# Patient Record
Sex: Male | Born: 1950 | Race: White | Hispanic: No
Health system: Southern US, Community
[De-identification: ages and names within clinical notes are randomized; demographics above are authoritative.]

---

## 2015-02-12 ENCOUNTER — Inpatient Hospital Stay
Admission: AD | Admit: 2015-02-12 | Discharge: 2015-04-18 | Disposition: E | Payer: Self-pay | Source: Ambulatory Visit | Attending: Internal Medicine | Admitting: Internal Medicine

## 2015-02-12 DIAGNOSIS — Z95828 Presence of other vascular implants and grafts: Secondary | ICD-10-CM

## 2015-02-12 DIAGNOSIS — I509 Heart failure, unspecified: Secondary | ICD-10-CM

## 2015-02-12 DIAGNOSIS — J9690 Respiratory failure, unspecified, unspecified whether with hypoxia or hypercapnia: Secondary | ICD-10-CM

## 2015-02-12 DIAGNOSIS — J189 Pneumonia, unspecified organism: Secondary | ICD-10-CM

## 2015-02-12 DIAGNOSIS — I4819 Other persistent atrial fibrillation: Secondary | ICD-10-CM | POA: Insufficient documentation

## 2015-02-12 DIAGNOSIS — Z01818 Encounter for other preprocedural examination: Secondary | ICD-10-CM

## 2015-02-12 DIAGNOSIS — R0602 Shortness of breath: Secondary | ICD-10-CM

## 2015-02-12 DIAGNOSIS — J9600 Acute respiratory failure, unspecified whether with hypoxia or hypercapnia: Secondary | ICD-10-CM

## 2015-02-13 ENCOUNTER — Other Ambulatory Visit (HOSPITAL_COMMUNITY): Payer: Self-pay

## 2015-02-13 LAB — PROTIME-INR
INR: 1.02 (ref 0.00–1.49)
PROTHROMBIN TIME: 13.5 s (ref 11.6–15.2)

## 2015-02-13 LAB — COMPREHENSIVE METABOLIC PANEL
ALT: 49 U/L (ref 0–53)
ANION GAP: 1 — AB (ref 5–15)
AST: 21 U/L (ref 0–37)
Albumin: 2.7 g/dL — ABNORMAL LOW (ref 3.5–5.2)
Alkaline Phosphatase: 46 U/L (ref 39–117)
BUN: 20 mg/dL (ref 6–23)
CO2: 45 mmol/L (ref 19–32)
Calcium: 8.4 mg/dL (ref 8.4–10.5)
Chloride: 93 mmol/L — ABNORMAL LOW (ref 96–112)
Creatinine, Ser: 0.5 mg/dL (ref 0.50–1.35)
GLUCOSE: 188 mg/dL — AB (ref 70–99)
POTASSIUM: 4.2 mmol/L (ref 3.5–5.1)
Sodium: 139 mmol/L (ref 135–145)
Total Bilirubin: 0.5 mg/dL (ref 0.3–1.2)
Total Protein: 4.9 g/dL — ABNORMAL LOW (ref 6.0–8.3)

## 2015-02-13 LAB — CBC
HEMATOCRIT: 29.4 % — AB (ref 39.0–52.0)
Hemoglobin: 9.1 g/dL — ABNORMAL LOW (ref 13.0–17.0)
MCH: 28.1 pg (ref 26.0–34.0)
MCHC: 31 g/dL (ref 30.0–36.0)
MCV: 90.7 fL (ref 78.0–100.0)
Platelets: 140 10*3/uL — ABNORMAL LOW (ref 150–400)
RBC: 3.24 MIL/uL — AB (ref 4.22–5.81)
RDW: 15.6 % — AB (ref 11.5–15.5)
WBC: 8.9 10*3/uL (ref 4.0–10.5)

## 2015-02-13 LAB — MAGNESIUM: MAGNESIUM: 1.6 mg/dL (ref 1.5–2.5)

## 2015-02-13 LAB — BLOOD GAS, ARTERIAL
Acid-Base Excess: 13.4 mmol/L — ABNORMAL HIGH (ref 0.0–2.0)
Bicarbonate: 39 mEq/L — ABNORMAL HIGH (ref 20.0–24.0)
O2 Content: 6 L/min
O2 SAT: 91.5 %
Patient temperature: 98.6
TCO2: 41 mmol/L (ref 0–100)
pCO2 arterial: 66.2 mmHg (ref 35.0–45.0)
pH, Arterial: 7.387 (ref 7.350–7.450)
pO2, Arterial: 62.8 mmHg — ABNORMAL LOW (ref 80.0–100.0)

## 2015-02-13 LAB — PHOSPHORUS: PHOSPHORUS: 4 mg/dL (ref 2.3–4.6)

## 2015-02-14 LAB — CBC WITH DIFFERENTIAL/PLATELET
Basophils Absolute: 0 10*3/uL (ref 0.0–0.1)
Basophils Relative: 0 % (ref 0–1)
EOS PCT: 0 % (ref 0–5)
Eosinophils Absolute: 0 10*3/uL (ref 0.0–0.7)
HCT: 31.9 % — ABNORMAL LOW (ref 39.0–52.0)
HEMOGLOBIN: 10 g/dL — AB (ref 13.0–17.0)
LYMPHS PCT: 2 % — AB (ref 12–46)
Lymphs Abs: 0.2 10*3/uL — ABNORMAL LOW (ref 0.7–4.0)
MCH: 27.9 pg (ref 26.0–34.0)
MCHC: 31.3 g/dL (ref 30.0–36.0)
MCV: 89.1 fL (ref 78.0–100.0)
Monocytes Absolute: 0.3 10*3/uL (ref 0.1–1.0)
Monocytes Relative: 3 % (ref 3–12)
NEUTROS ABS: 8.5 10*3/uL — AB (ref 1.7–7.7)
NEUTROS PCT: 95 % — AB (ref 43–77)
PLATELETS: 148 10*3/uL — AB (ref 150–400)
RBC: 3.58 MIL/uL — AB (ref 4.22–5.81)
RDW: 15.4 % (ref 11.5–15.5)
WBC: 9.1 10*3/uL (ref 4.0–10.5)

## 2015-02-15 LAB — HEMOGLOBIN A1C
Hgb A1c MFr Bld: 7.1 % — ABNORMAL HIGH (ref 4.8–5.6)
Mean Plasma Glucose: 157 mg/dL

## 2015-02-15 LAB — POTASSIUM: POTASSIUM: 3.6 mmol/L (ref 3.5–5.1)

## 2015-02-16 LAB — BASIC METABOLIC PANEL
ANION GAP: 6 (ref 5–15)
BUN: 24 mg/dL — ABNORMAL HIGH (ref 6–23)
CALCIUM: 8.8 mg/dL (ref 8.4–10.5)
CHLORIDE: 94 mmol/L — AB (ref 96–112)
CO2: 38 mmol/L — ABNORMAL HIGH (ref 19–32)
Creatinine, Ser: 0.5 mg/dL (ref 0.50–1.35)
GFR calc Af Amer: >90 mL/min (ref >90)
Glucose, Bld: 146 mg/dL — ABNORMAL HIGH (ref 70–99)
Potassium: 4.7 mmol/L (ref 3.5–5.1)
SODIUM: 138 mmol/L (ref 135–145)

## 2015-02-16 DEATH — deceased

## 2015-02-17 LAB — BASIC METABOLIC PANEL
Anion gap: 3 — ABNORMAL LOW (ref 5–15)
BUN: 26 mg/dL — ABNORMAL HIGH (ref 6–23)
CO2: 40 mmol/L — AB (ref 19–32)
CREATININE: 0.54 mg/dL (ref 0.50–1.35)
Calcium: 8.5 mg/dL (ref 8.4–10.5)
Chloride: 92 mmol/L — ABNORMAL LOW (ref 96–112)
GFR calc Af Amer: >90 mL/min (ref >90)
GFR calc non Af Amer: >90 mL/min (ref >90)
Glucose, Bld: 160 mg/dL — ABNORMAL HIGH (ref 70–99)
Potassium: 4.9 mmol/L (ref 3.5–5.1)
Sodium: 135 mmol/L (ref 135–145)

## 2015-02-18 LAB — POTASSIUM: POTASSIUM: 4.7 mmol/L (ref 3.5–5.1)

## 2015-02-19 LAB — CBC
HCT: 28.7 % — ABNORMAL LOW (ref 39.0–52.0)
Hemoglobin: 9 g/dL — ABNORMAL LOW (ref 13.0–17.0)
MCH: 28.1 pg (ref 26.0–34.0)
MCHC: 31.4 g/dL (ref 30.0–36.0)
MCV: 89.7 fL (ref 78.0–100.0)
PLATELETS: 194 10*3/uL (ref 150–400)
RBC: 3.2 MIL/uL — ABNORMAL LOW (ref 4.22–5.81)
RDW: 16.2 % — ABNORMAL HIGH (ref 11.5–15.5)
WBC: 6.7 10*3/uL (ref 4.0–10.5)

## 2015-02-21 LAB — BLOOD GAS, ARTERIAL

## 2015-02-22 LAB — BASIC METABOLIC PANEL
Anion gap: 5 (ref 5–15)
BUN: 30 mg/dL — AB (ref 6–23)
CHLORIDE: 90 mmol/L — AB (ref 96–112)
CO2: 39 mmol/L — AB (ref 19–32)
Calcium: 8.9 mg/dL (ref 8.4–10.5)
Creatinine, Ser: 0.52 mg/dL (ref 0.50–1.35)
GFR calc Af Amer: >90 mL/min (ref >90)
GFR calc non Af Amer: >90 mL/min (ref >90)
GLUCOSE: 115 mg/dL — AB (ref 70–99)
POTASSIUM: 4.6 mmol/L (ref 3.5–5.1)
Sodium: 134 mmol/L — ABNORMAL LOW (ref 135–145)

## 2015-02-22 LAB — CBC
HEMATOCRIT: 33.1 % — AB (ref 39.0–52.0)
HEMOGLOBIN: 10.4 g/dL — AB (ref 13.0–17.0)
MCH: 28.5 pg (ref 26.0–34.0)
MCHC: 31.4 g/dL (ref 30.0–36.0)
MCV: 90.7 fL (ref 78.0–100.0)
Platelets: 288 10*3/uL (ref 150–400)
RBC: 3.65 MIL/uL — ABNORMAL LOW (ref 4.22–5.81)
RDW: 17.3 % — ABNORMAL HIGH (ref 11.5–15.5)
WBC: 13.2 10*3/uL — ABNORMAL HIGH (ref 4.0–10.5)

## 2015-02-25 ENCOUNTER — Other Ambulatory Visit (HOSPITAL_COMMUNITY): Payer: Self-pay

## 2015-02-25 LAB — URINALYSIS, ROUTINE W REFLEX MICROSCOPIC
Bilirubin Urine: NEGATIVE
Glucose, UA: NEGATIVE mg/dL
Hgb urine dipstick: NEGATIVE
Ketones, ur: NEGATIVE mg/dL
Leukocytes, UA: NEGATIVE
NITRITE: NEGATIVE
PH: 6.5 (ref 5.0–8.0)
Protein, ur: NEGATIVE mg/dL
Specific Gravity, Urine: 1.01 (ref 1.005–1.030)
Urobilinogen, UA: 0.2 mg/dL (ref 0.0–1.0)

## 2015-02-25 LAB — CBC WITH DIFFERENTIAL/PLATELET
BAND NEUTROPHILS: 16 % — AB (ref 0–10)
BLASTS: 2 %
Basophils Absolute: 0 10*3/uL (ref 0.0–0.1)
Basophils Relative: 0 % (ref 0–1)
Eosinophils Absolute: 0 10*3/uL (ref 0.0–0.7)
Eosinophils Relative: 0 % (ref 0–5)
HEMATOCRIT: 33.4 % — AB (ref 39.0–52.0)
HEMOGLOBIN: 10.5 g/dL — AB (ref 13.0–17.0)
Lymphocytes Relative: 9 % — ABNORMAL LOW (ref 12–46)
Lymphs Abs: 2 10*3/uL (ref 0.7–4.0)
MCH: 29.2 pg (ref 26.0–34.0)
MCHC: 31.4 g/dL (ref 30.0–36.0)
MCV: 92.8 fL (ref 78.0–100.0)
METAMYELOCYTES PCT: 4 %
Monocytes Absolute: 2 10*3/uL — ABNORMAL HIGH (ref 0.1–1.0)
Monocytes Relative: 9 % (ref 3–12)
Myelocytes: 5 %
NEUTROS PCT: 55 % (ref 43–77)
Neutro Abs: 17.5 10*3/uL — ABNORMAL HIGH (ref 1.7–7.7)
PLATELETS: 278 10*3/uL (ref 150–400)
Promyelocytes Absolute: 0 %
RBC: 3.6 MIL/uL — ABNORMAL LOW (ref 4.22–5.81)
RDW: 18.5 % — AB (ref 11.5–15.5)
Smear Review: ADEQUATE
WBC: 21.9 10*3/uL — ABNORMAL HIGH (ref 4.0–10.5)
nRBC: 0 /100 WBC

## 2015-02-25 LAB — BASIC METABOLIC PANEL
Anion gap: 8 (ref 5–15)
BUN: 28 mg/dL — ABNORMAL HIGH (ref 6–23)
CHLORIDE: 90 mmol/L — AB (ref 96–112)
CO2: 36 mmol/L — AB (ref 19–32)
Calcium: 9 mg/dL (ref 8.4–10.5)
Creatinine, Ser: 0.55 mg/dL (ref 0.50–1.35)
GFR calc non Af Amer: >90 mL/min (ref >90)
GLUCOSE: 144 mg/dL — AB (ref 70–99)
Potassium: 5.1 mmol/L (ref 3.5–5.1)
Sodium: 134 mmol/L — ABNORMAL LOW (ref 135–145)

## 2015-02-25 LAB — MAGNESIUM: Magnesium: 1.9 mg/dL (ref 1.5–2.5)

## 2015-02-25 LAB — PHOSPHORUS: Phosphorus: 4.2 mg/dL (ref 2.3–4.6)

## 2015-02-26 LAB — URINE CULTURE
CULTURE: NO GROWTH
Colony Count: NO GROWTH

## 2015-02-26 LAB — BASIC METABOLIC PANEL
ANION GAP: 3 — AB (ref 5–15)
BUN: 28 mg/dL — ABNORMAL HIGH (ref 6–23)
CO2: 40 mmol/L (ref 19–32)
Calcium: 9 mg/dL (ref 8.4–10.5)
Chloride: 91 mmol/L — ABNORMAL LOW (ref 96–112)
Creatinine, Ser: 0.57 mg/dL (ref 0.50–1.35)
GFR calc non Af Amer: >90 mL/min (ref >90)
Glucose, Bld: 144 mg/dL — ABNORMAL HIGH (ref 70–99)
Potassium: 4.7 mmol/L (ref 3.5–5.1)
SODIUM: 134 mmol/L — AB (ref 135–145)

## 2015-02-26 LAB — CBC WITH DIFFERENTIAL/PLATELET
BASOS ABS: 0.2 10*3/uL — AB (ref 0.0–0.1)
Basophils Relative: 1 % (ref 0–1)
EOS ABS: 0 10*3/uL (ref 0.0–0.7)
Eosinophils Relative: 0 % (ref 0–5)
HCT: 34.1 % — ABNORMAL LOW (ref 39.0–52.0)
Hemoglobin: 10.5 g/dL — ABNORMAL LOW (ref 13.0–17.0)
LYMPHS PCT: 6 % — AB (ref 12–46)
Lymphs Abs: 1 10*3/uL (ref 0.7–4.0)
MCH: 28.7 pg (ref 26.0–34.0)
MCHC: 30.8 g/dL (ref 30.0–36.0)
MCV: 93.2 fL (ref 78.0–100.0)
Monocytes Absolute: 1.2 10*3/uL — ABNORMAL HIGH (ref 0.1–1.0)
Monocytes Relative: 7 % (ref 3–12)
NEUTROS PCT: 86 % — AB (ref 43–77)
Neutro Abs: 14.5 10*3/uL — ABNORMAL HIGH (ref 1.7–7.7)
Platelets: 238 10*3/uL (ref 150–400)
RBC: 3.66 MIL/uL — ABNORMAL LOW (ref 4.22–5.81)
RDW: 18.6 % — AB (ref 11.5–15.5)
WBC: 16.9 10*3/uL — ABNORMAL HIGH (ref 4.0–10.5)

## 2015-02-26 LAB — CLOSTRIDIUM DIFFICILE BY PCR: CDIFFPCR: NEGATIVE

## 2015-02-27 LAB — PATHOLOGIST SMEAR REVIEW

## 2015-02-28 LAB — CBC WITH DIFFERENTIAL/PLATELET
Basophils Absolute: 0.1 10*3/uL (ref 0.0–0.1)
Basophils Relative: 0 % (ref 0–1)
EOS PCT: 0 % (ref 0–5)
Eosinophils Absolute: 0.1 10*3/uL (ref 0.0–0.7)
HCT: 30.5 % — ABNORMAL LOW (ref 39.0–52.0)
Hemoglobin: 9.8 g/dL — ABNORMAL LOW (ref 13.0–17.0)
Lymphocytes Relative: 5 % — ABNORMAL LOW (ref 12–46)
Lymphs Abs: 1 10*3/uL (ref 0.7–4.0)
MCH: 30 pg (ref 26.0–34.0)
MCHC: 32.1 g/dL (ref 30.0–36.0)
MCV: 93.3 fL (ref 78.0–100.0)
MONOS PCT: 8 % (ref 3–12)
Monocytes Absolute: 1.5 10*3/uL — ABNORMAL HIGH (ref 0.1–1.0)
NEUTROS PCT: 87 % — AB (ref 43–77)
Neutro Abs: 16.3 10*3/uL — ABNORMAL HIGH (ref 1.7–7.7)
Platelets: 161 10*3/uL (ref 150–400)
RBC: 3.27 MIL/uL — ABNORMAL LOW (ref 4.22–5.81)
RDW: 18.9 % — ABNORMAL HIGH (ref 11.5–15.5)
WBC: 18.9 10*3/uL — AB (ref 4.0–10.5)

## 2015-02-28 LAB — BASIC METABOLIC PANEL
ANION GAP: 9 (ref 5–15)
BUN: 31 mg/dL — AB (ref 6–23)
CHLORIDE: 91 mmol/L — AB (ref 96–112)
CO2: 35 mmol/L — AB (ref 19–32)
Calcium: 8.8 mg/dL (ref 8.4–10.5)
Creatinine, Ser: 0.6 mg/dL (ref 0.50–1.35)
GFR calc Af Amer: >90 mL/min (ref >90)
GFR calc non Af Amer: >90 mL/min (ref >90)
Glucose, Bld: 129 mg/dL — ABNORMAL HIGH (ref 70–99)
Potassium: 4.6 mmol/L (ref 3.5–5.1)
Sodium: 135 mmol/L (ref 135–145)

## 2015-02-28 LAB — PHOSPHORUS: Phosphorus: 4.2 mg/dL (ref 2.3–4.6)

## 2015-02-28 LAB — MAGNESIUM: Magnesium: 1.8 mg/dL (ref 1.5–2.5)

## 2015-03-01 ENCOUNTER — Other Ambulatory Visit (HOSPITAL_COMMUNITY): Payer: Self-pay

## 2015-03-01 LAB — CBC WITH DIFFERENTIAL/PLATELET
BASOS ABS: 0 10*3/uL (ref 0.0–0.1)
BASOS PCT: 0 % (ref 0–1)
Band Neutrophils: 23 % — ABNORMAL HIGH (ref 0–10)
Blasts: 0 %
EOS ABS: 0.6 10*3/uL (ref 0.0–0.7)
EOS PCT: 1 % (ref 0–5)
HCT: 31.7 % — ABNORMAL LOW (ref 39.0–52.0)
HEMOGLOBIN: 10.1 g/dL — AB (ref 13.0–17.0)
Lymphocytes Relative: 1 % — ABNORMAL LOW (ref 12–46)
Lymphs Abs: 0.6 10*3/uL — ABNORMAL LOW (ref 0.7–4.0)
MCH: 29.2 pg (ref 26.0–34.0)
MCHC: 31.9 g/dL (ref 30.0–36.0)
MCV: 91.6 fL (ref 78.0–100.0)
MONO ABS: 2.9 10*3/uL — AB (ref 0.1–1.0)
MYELOCYTES: 0 %
Metamyelocytes Relative: 0 %
Monocytes Relative: 5 % (ref 3–12)
NEUTROS ABS: 54 10*3/uL — AB (ref 1.7–7.7)
NEUTROS PCT: 70 % (ref 43–77)
Platelets: 172 10*3/uL (ref 150–400)
Promyelocytes Absolute: 0 %
RBC: 3.46 MIL/uL — ABNORMAL LOW (ref 4.22–5.81)
RDW: 18.8 % — ABNORMAL HIGH (ref 11.5–15.5)
WBC: 58.1 10*3/uL (ref 4.0–10.5)
nRBC: 0 /100 WBC

## 2015-03-01 LAB — BLOOD GAS, ARTERIAL
ACID-BASE EXCESS: 6.7 mmol/L — AB (ref 0.0–2.0)
Acid-Base Excess: 3 mmol/L — ABNORMAL HIGH (ref 0.0–2.0)
BICARBONATE: 28 meq/L — AB (ref 20.0–24.0)
BICARBONATE: 31.6 meq/L — AB (ref 20.0–24.0)
FIO2: 0.5 %
O2 CONTENT: 6 L/min
O2 SAT: 86.9 %
O2 SAT: 82.8 %
PATIENT TEMPERATURE: 98.6
PO2 ART: 49.4 mmHg — AB (ref 80.0–100.0)
Patient temperature: 98.6
TCO2: 29.5 mmol/L (ref 0–100)
TCO2: 33.3 mmol/L (ref 0–100)
pCO2 arterial: 51.1 mmHg — ABNORMAL HIGH (ref 35.0–45.0)
pCO2 arterial: 54 mmHg — ABNORMAL HIGH (ref 35.0–45.0)
pH, Arterial: 7.358 (ref 7.350–7.450)
pH, Arterial: 7.386 (ref 7.350–7.450)
pO2, Arterial: 55.9 mmHg — ABNORMAL LOW (ref 80.0–100.0)

## 2015-03-01 LAB — COMPREHENSIVE METABOLIC PANEL
ALT: 41 U/L (ref 0–53)
AST: 26 U/L (ref 0–37)
Albumin: 2.6 g/dL — ABNORMAL LOW (ref 3.5–5.2)
Alkaline Phosphatase: 62 U/L (ref 39–117)
Anion gap: 10 (ref 5–15)
BILIRUBIN TOTAL: 0.6 mg/dL (ref 0.3–1.2)
BUN: 31 mg/dL — ABNORMAL HIGH (ref 6–23)
CALCIUM: 8.4 mg/dL (ref 8.4–10.5)
CHLORIDE: 90 mmol/L — AB (ref 96–112)
CO2: 32 mmol/L (ref 19–32)
CREATININE: 0.95 mg/dL (ref 0.50–1.35)
GFR calc non Af Amer: 85 mL/min — ABNORMAL LOW (ref >90)
GLUCOSE: 42 mg/dL — AB (ref 70–99)
Potassium: 4 mmol/L (ref 3.5–5.1)
SODIUM: 132 mmol/L — AB (ref 135–145)
Total Protein: 5.1 g/dL — ABNORMAL LOW (ref 6.0–8.3)

## 2015-03-01 LAB — URINALYSIS, ROUTINE W REFLEX MICROSCOPIC
Bilirubin Urine: NEGATIVE
Glucose, UA: NEGATIVE mg/dL
HGB URINE DIPSTICK: NEGATIVE
Ketones, ur: NEGATIVE mg/dL
LEUKOCYTES UA: NEGATIVE
Nitrite: NEGATIVE
PH: 6.5 (ref 5.0–8.0)
PROTEIN: NEGATIVE mg/dL
SPECIFIC GRAVITY, URINE: 1.018 (ref 1.005–1.030)
Urobilinogen, UA: 1 mg/dL (ref 0.0–1.0)

## 2015-03-01 LAB — CK TOTAL AND CKMB (NOT AT ARMC)
CK, MB: 3.4 ng/mL (ref 0.3–4.0)
Relative Index: INVALID (ref 0.0–2.5)
Total CK: 19 U/L (ref 7–232)

## 2015-03-01 LAB — LACTIC ACID, PLASMA
LACTIC ACID, VENOUS: 2.1 mmol/L — AB (ref 0.5–2.0)
Lactic Acid, Venous: 1.8 mmol/L (ref 0.5–2.0)

## 2015-03-01 LAB — TROPONIN I
TROPONIN I: 0.07 ng/mL — AB (ref <0.031)
Troponin I: 0.05 ng/mL — ABNORMAL HIGH (ref <0.031)

## 2015-03-01 LAB — PROCALCITONIN: Procalcitonin: 4.16 ng/mL

## 2015-03-02 ENCOUNTER — Other Ambulatory Visit (HOSPITAL_COMMUNITY): Payer: Self-pay

## 2015-03-02 LAB — BASIC METABOLIC PANEL
Anion gap: 9 (ref 5–15)
BUN: 19 mg/dL (ref 6–23)
CALCIUM: 8.2 mg/dL — AB (ref 8.4–10.5)
CO2: 29 mmol/L (ref 19–32)
CREATININE: 0.6 mg/dL (ref 0.50–1.35)
Chloride: 94 mmol/L — ABNORMAL LOW (ref 96–112)
GFR calc Af Amer: >90 mL/min (ref >90)
GLUCOSE: 133 mg/dL — AB (ref 70–99)
POTASSIUM: 3.5 mmol/L (ref 3.5–5.1)
SODIUM: 132 mmol/L — AB (ref 135–145)

## 2015-03-02 LAB — CBC WITH DIFFERENTIAL/PLATELET
BASOS ABS: 0 10*3/uL (ref 0.0–0.1)
Basophils Relative: 0 % (ref 0–1)
Eosinophils Absolute: 0 10*3/uL (ref 0.0–0.7)
Eosinophils Relative: 0 % (ref 0–5)
HEMATOCRIT: 29.5 % — AB (ref 39.0–52.0)
Hemoglobin: 9.5 g/dL — ABNORMAL LOW (ref 13.0–17.0)
LYMPHS ABS: 1.1 10*3/uL (ref 0.7–4.0)
LYMPHS PCT: 2 % — AB (ref 12–46)
MCH: 29.8 pg (ref 26.0–34.0)
MCHC: 32.2 g/dL (ref 30.0–36.0)
MCV: 92.5 fL (ref 78.0–100.0)
Monocytes Absolute: 4.6 10*3/uL — ABNORMAL HIGH (ref 0.1–1.0)
Monocytes Relative: 8 % (ref 3–12)
NEUTROS ABS: 51.5 10*3/uL — AB (ref 1.7–7.7)
Neutrophils Relative %: 90 % — ABNORMAL HIGH (ref 43–77)
PLATELETS: 133 10*3/uL — AB (ref 150–400)
RBC: 3.19 MIL/uL — AB (ref 4.22–5.81)
RDW: 18.4 % — AB (ref 11.5–15.5)
WBC: 57.2 10*3/uL (ref 4.0–10.5)

## 2015-03-02 LAB — BLOOD GAS, ARTERIAL
ACID-BASE EXCESS: 5.5 mmol/L — AB (ref 0.0–2.0)
BICARBONATE: 30.7 meq/L — AB (ref 20.0–24.0)
Delivery systems: POSITIVE
Expiratory PAP: 6
FIO2: 0.6 %
INSPIRATORY PAP: 12
O2 Saturation: 97.1 %
PATIENT TEMPERATURE: 95.9
PO2 ART: 74.2 mmHg — AB (ref 80.0–100.0)
TCO2: 32.3 mmol/L (ref 0–100)
pCO2 arterial: 51.2 mmHg — ABNORMAL HIGH (ref 35.0–45.0)
pH, Arterial: 7.386 (ref 7.350–7.450)

## 2015-03-02 LAB — LACTIC ACID, PLASMA: LACTIC ACID, VENOUS: 0.8 mmol/L (ref 0.5–2.0)

## 2015-03-02 LAB — TROPONIN I
TROPONIN I: 0.07 ng/mL — AB (ref <0.031)
Troponin I: 0.07 ng/mL — ABNORMAL HIGH (ref <0.031)
Troponin I: 0.12 ng/mL — ABNORMAL HIGH (ref <0.031)
Troponin I: 0.13 ng/mL — ABNORMAL HIGH (ref <0.031)

## 2015-03-03 ENCOUNTER — Encounter: Payer: Self-pay | Admitting: Radiology

## 2015-03-03 ENCOUNTER — Institutional Professional Consult (permissible substitution) (HOSPITAL_COMMUNITY): Payer: Self-pay

## 2015-03-03 LAB — CBC WITH DIFFERENTIAL/PLATELET
BASOS ABS: 0.4 10*3/uL — AB (ref 0.0–0.1)
Basophils Absolute: 0 10*3/uL (ref 0.0–0.1)
Basophils Relative: 1 % (ref 0–1)
Basophils Relative: 0 % (ref 0–1)
EOS ABS: 0 10*3/uL (ref 0.0–0.7)
Eosinophils Absolute: 0 10*3/uL (ref 0.0–0.7)
Eosinophils Relative: 0 % (ref 0–5)
Eosinophils Relative: 0 % (ref 0–5)
HCT: 25.9 % — ABNORMAL LOW (ref 39.0–52.0)
HEMATOCRIT: 29.3 % — AB (ref 39.0–52.0)
HEMOGLOBIN: 9.1 g/dL — AB (ref 13.0–17.0)
HEMOGLOBIN: 8.2 g/dL — AB (ref 13.0–17.0)
LYMPHS PCT: 2 % — AB (ref 12–46)
Lymphocytes Relative: 2 % — ABNORMAL LOW (ref 12–46)
Lymphs Abs: 0.9 10*3/uL (ref 0.7–4.0)
Lymphs Abs: 0.5 10*3/uL — ABNORMAL LOW (ref 0.7–4.0)
MCH: 28.7 pg (ref 26.0–34.0)
MCH: 29.1 pg (ref 26.0–34.0)
MCHC: 31.1 g/dL (ref 30.0–36.0)
MCHC: 31.7 g/dL (ref 30.0–36.0)
MCV: 92.4 fL (ref 78.0–100.0)
MCV: 91.8 fL (ref 78.0–100.0)
MONOS PCT: 5 % (ref 3–12)
MONOS PCT: 4 % (ref 3–12)
Monocytes Absolute: 2.2 10*3/uL — ABNORMAL HIGH (ref 0.1–1.0)
Monocytes Absolute: 1.1 10*3/uL — ABNORMAL HIGH (ref 0.1–1.0)
NEUTROS ABS: 39.5 10*3/uL — AB (ref 1.7–7.7)
NEUTROS ABS: 25.3 10*3/uL — AB (ref 1.7–7.7)
Neutrophils Relative %: 92 % — ABNORMAL HIGH (ref 43–77)
Neutrophils Relative %: 94 % — ABNORMAL HIGH (ref 43–77)
PLATELETS: 106 10*3/uL — AB (ref 150–400)
Platelets: 113 10*3/uL — ABNORMAL LOW (ref 150–400)
RBC: 3.17 MIL/uL — AB (ref 4.22–5.81)
RBC: 2.82 MIL/uL — ABNORMAL LOW (ref 4.22–5.81)
RDW: 18.2 % — ABNORMAL HIGH (ref 11.5–15.5)
RDW: 18.3 % — ABNORMAL HIGH (ref 11.5–15.5)
WBC: 43 10*3/uL — AB (ref 4.0–10.5)
WBC: 26.9 10*3/uL — ABNORMAL HIGH (ref 4.0–10.5)

## 2015-03-03 LAB — TROPONIN I
TROPONIN I: 0.24 ng/mL — AB (ref <0.031)
TROPONIN I: 0.03 ng/mL (ref <0.031)
Troponin I: 0.07 ng/mL — ABNORMAL HIGH (ref <0.031)

## 2015-03-03 LAB — URINE CULTURE: Colony Count: >=100000

## 2015-03-03 LAB — CK TOTAL AND CKMB (NOT AT ARMC)
CK, MB: 5.3 ng/mL — ABNORMAL HIGH (ref 0.3–4.0)
RELATIVE INDEX: INVALID (ref 0.0–2.5)
Total CK: 27 U/L (ref 7–232)

## 2015-03-03 LAB — BASIC METABOLIC PANEL
ANION GAP: 7 (ref 5–15)
Anion gap: 13 (ref 5–15)
BUN: 16 mg/dL (ref 6–23)
BUN: 12 mg/dL (ref 6–23)
CALCIUM: 8.1 mg/dL — AB (ref 8.4–10.5)
CHLORIDE: 93 mmol/L — AB (ref 96–112)
CHLORIDE: 100 mmol/L (ref 96–112)
CO2: 26 mmol/L (ref 19–32)
CO2: 29 mmol/L (ref 19–32)
CREATININE: 0.56 mg/dL (ref 0.50–1.35)
CREATININE: 0.47 mg/dL — AB (ref 0.50–1.35)
Calcium: 7.9 mg/dL — ABNORMAL LOW (ref 8.4–10.5)
GFR calc Af Amer: >90 mL/min (ref >90)
GFR calc Af Amer: >90 mL/min (ref >90)
GFR calc non Af Amer: >90 mL/min (ref >90)
GLUCOSE: 230 mg/dL — AB (ref 70–99)
Glucose, Bld: 225 mg/dL — ABNORMAL HIGH (ref 70–99)
POTASSIUM: 4.2 mmol/L (ref 3.5–5.1)
Potassium: 3.4 mmol/L — ABNORMAL LOW (ref 3.5–5.1)
SODIUM: 132 mmol/L — AB (ref 135–145)
Sodium: 136 mmol/L (ref 135–145)

## 2015-03-03 LAB — EXPECTORATED SPUTUM ASSESSMENT W GRAM STAIN, RFLX TO RESP C

## 2015-03-03 LAB — EXPECTORATED SPUTUM ASSESSMENT W REFEX TO RESP CULTURE

## 2015-03-03 LAB — DIGOXIN LEVEL: Digoxin Level: <0.2 ng/mL — ABNORMAL LOW (ref 0.8–2.0)

## 2015-03-03 LAB — MAGNESIUM: Magnesium: 1.7 mg/dL (ref 1.5–2.5)

## 2015-03-03 MED ORDER — IOHEXOL 300 MG/ML  SOLN
100.0000 mL | Freq: Once | INTRAMUSCULAR | Status: AC | PRN
  Administered 2015-03-03: 100 mL via INTRAVENOUS

## 2015-03-04 LAB — CULTURE, BLOOD (ROUTINE X 2)

## 2015-03-04 LAB — TROPONIN I: Troponin I: 0.07 ng/mL — ABNORMAL HIGH (ref <0.031)

## 2015-03-05 LAB — CBC WITH DIFFERENTIAL/PLATELET
BASOS ABS: 0 10*3/uL (ref 0.0–0.1)
BASOS PCT: 0 % (ref 0–1)
Eosinophils Absolute: 0.1 10*3/uL (ref 0.0–0.7)
Eosinophils Relative: 1 % (ref 0–5)
HCT: 28 % — ABNORMAL LOW (ref 39.0–52.0)
Hemoglobin: 8.4 g/dL — ABNORMAL LOW (ref 13.0–17.0)
Lymphocytes Relative: 6 % — ABNORMAL LOW (ref 12–46)
Lymphs Abs: 0.7 10*3/uL (ref 0.7–4.0)
MCH: 28.4 pg (ref 26.0–34.0)
MCHC: 30 g/dL (ref 30.0–36.0)
MCV: 94.6 fL (ref 78.0–100.0)
Monocytes Absolute: 0.7 10*3/uL (ref 0.1–1.0)
Monocytes Relative: 7 % (ref 3–12)
NEUTROS PCT: 87 % — AB (ref 43–77)
Neutro Abs: 9.5 10*3/uL — ABNORMAL HIGH (ref 1.7–7.7)
Platelets: 135 10*3/uL — ABNORMAL LOW (ref 150–400)
RBC: 2.96 MIL/uL — AB (ref 4.22–5.81)
RDW: 18.3 % — ABNORMAL HIGH (ref 11.5–15.5)
WBC: 11 10*3/uL — ABNORMAL HIGH (ref 4.0–10.5)

## 2015-03-05 LAB — COMPREHENSIVE METABOLIC PANEL
ALBUMIN: 2 g/dL — AB (ref 3.5–5.2)
ALT: 82 U/L — ABNORMAL HIGH (ref 0–53)
AST: 26 U/L (ref 0–37)
Alkaline Phosphatase: 103 U/L (ref 39–117)
Anion gap: 8 (ref 5–15)
BUN: 15 mg/dL (ref 6–23)
CALCIUM: 8.7 mg/dL (ref 8.4–10.5)
CO2: 33 mmol/L — ABNORMAL HIGH (ref 19–32)
Chloride: 100 mmol/L (ref 96–112)
Creatinine, Ser: 0.44 mg/dL — ABNORMAL LOW (ref 0.50–1.35)
GFR calc Af Amer: >90 mL/min (ref >90)
GFR calc non Af Amer: >90 mL/min (ref >90)
Glucose, Bld: 107 mg/dL — ABNORMAL HIGH (ref 70–99)
Potassium: 3.7 mmol/L (ref 3.5–5.1)
Sodium: 141 mmol/L (ref 135–145)
TOTAL PROTEIN: 4.8 g/dL — AB (ref 6.0–8.3)
Total Bilirubin: 0.5 mg/dL (ref 0.3–1.2)

## 2015-03-05 LAB — DIGOXIN LEVEL: DIGOXIN LVL: 0.5 ng/mL — AB (ref 0.8–2.0)

## 2015-03-06 DIAGNOSIS — J189 Pneumonia, unspecified organism: Secondary | ICD-10-CM

## 2015-03-06 DIAGNOSIS — I4891 Unspecified atrial fibrillation: Secondary | ICD-10-CM

## 2015-03-06 DIAGNOSIS — I4819 Other persistent atrial fibrillation: Secondary | ICD-10-CM | POA: Insufficient documentation

## 2015-03-06 DIAGNOSIS — I481 Persistent atrial fibrillation: Secondary | ICD-10-CM

## 2015-03-06 LAB — TSH: TSH: 0.994 u[IU]/mL (ref 0.350–4.500)

## 2015-03-06 LAB — CBC
HCT: 29.7 % — ABNORMAL LOW (ref 39.0–52.0)
Hemoglobin: 9.1 g/dL — ABNORMAL LOW (ref 13.0–17.0)
MCH: 28.5 pg (ref 26.0–34.0)
MCHC: 30.6 g/dL (ref 30.0–36.0)
MCV: 93.1 fL (ref 78.0–100.0)
Platelets: 189 10*3/uL (ref 150–400)
RBC: 3.19 MIL/uL — AB (ref 4.22–5.81)
RDW: 18.6 % — ABNORMAL HIGH (ref 11.5–15.5)
WBC: 15.4 10*3/uL — AB (ref 4.0–10.5)

## 2015-03-06 LAB — TROPONIN I: Troponin I: 0.03 ng/mL (ref <0.031)

## 2015-03-06 LAB — CULTURE, RESPIRATORY W GRAM STAIN

## 2015-03-06 LAB — CK TOTAL AND CKMB (NOT AT ARMC)
CK, MB: 3.2 ng/mL (ref 0.3–4.0)
Relative Index: INVALID (ref 0.0–2.5)
Total CK: 18 U/L (ref 7–232)

## 2015-03-06 LAB — CULTURE, RESPIRATORY

## 2015-03-06 LAB — BRAIN NATRIURETIC PEPTIDE: B NATRIURETIC PEPTIDE 5: 900.6 pg/mL — AB (ref 0.0–100.0)

## 2015-03-06 NOTE — Progress Notes (Signed)
  Echocardiogram 2D Echocardiogram has been performed.  Janalyn HarderWest, Jetson Pickrel R 03/06/2015, 2:23 PM

## 2015-03-06 NOTE — Consult Note (Signed)
    Primary Physician: Primary Cardiologist:  New   Asked to see re tachycardia   HPI: [place] was adm[facility] on 12/08/14 [facility]  Patient has hisitory of atrial fib, small cell lung CA, HTN, GERD, CAD , tob use, narcotic use, COPD, MRSA   Earlier in hospitalization was in SR   [place]  Early April as on 3L [place] and weaned from ABX  Still SOB  On 4/14 HR increased    WBC 58 K  CXR with LLL consolication   Bl Cult + MRSA; Urine + MRSA   Patient treated with IV and po diltiazem, IV lopressor, digoxin    On 4/17 Levophed d/c'd   Yesterday HR consistently in 160s starting in PM  This AM HR in 150s (afib)  Around 1 PM tachycardia increased to 170      CUrrent meds::  Amiodarone 400 bid   Metoprolol 25 q 8 hours;  Dilt IV  Digoxin 0.125 IV  Pepcid, Nicotene patch, Solucortef, oxycontin, Lovenox qd.   Allergies :  ASA  PMH  As noted above.    FHx.  Negative for CAD  PSH:  S/p lung surgery  SH:  Single  + tobacco  +narcotics        REVIEW OF SYSTEMS:  All systems reviewed  Negative to the above problem except as noted above.    PHYSICAL EXAM: BP 95-140/60-70s  HR 170s      No intake or output data in the 24 hours ending 03/06/15 1804  General:  Patinet is in NAD though on 100% NRB HEENT: normal Neck: supple. no JVD. Carotids 2+ bilat; no bruits. No lymphadenopathy or thryomegaly appreciated. Cor: PMI nondisplaced. Regular rate & rhythm. No rubs, gallops or murmurs. Lungs: Decreased aiflow bilaterally   Abdomen: soft, nontender, nondistended. No hepatosplenomegaly. No bruits or masses. Good bowel sounds. Extremities: no cyanosis, clubbing, rash, edema Neuro: alert & oriented x 3, cranial nerves grossly intact. moves all 4 extremities w/o difficulty. Affect pleasant.  ECG  SVT  Probable atrial fib with RVR  173 bpm    Results for orders placed or performed during the hospital encounter of 001/22/16 (from the past 24 hour(s))   CK total and CKMB  (cardiac)     Status: None   Collection Time: 03/06/15 11:10 AM  Result Value Ref Range   Total CK 18 7 - 232 U/L   CK, MB 3.2 0.3 - 4.0 ng/mL   Relative Index RELATIVE INDEX IS INVALID 0.0 - 2.5  Troponin I     Status: None   Collection Time: 03/06/15 11:10 AM  Result Value Ref Range   Troponin I 0.03 <0.031 ng/mL   No results found.   ASSESSMENT: 64 yo male with atrial fib Has been in and out up to this point   Now with very rapid rates.   BP has been adequate.    Would d/c dig and po amiodarone  Continue dilt  Begin IV amiodarone     Lung dz will be limiting.    Echo today is limited due to HR  Grossly , LVEF and RVEF appear normal  Watch IV fluids  Currently several liters positive   2  HTN  Follow  3.  ID  On ABX for pneumonia, bacteremia  Off pressors

## 2015-03-07 ENCOUNTER — Other Ambulatory Visit (HOSPITAL_COMMUNITY): Payer: Self-pay

## 2015-03-07 LAB — CBC WITH DIFFERENTIAL/PLATELET
BASOS PCT: 0 % (ref 0–1)
Basophils Absolute: 0 10*3/uL (ref 0.0–0.1)
EOS ABS: 0.1 10*3/uL (ref 0.0–0.7)
EOS PCT: 1 % (ref 0–5)
HCT: 29.5 % — ABNORMAL LOW (ref 39.0–52.0)
HEMOGLOBIN: 8.9 g/dL — AB (ref 13.0–17.0)
LYMPHS ABS: 0.8 10*3/uL (ref 0.7–4.0)
Lymphocytes Relative: 5 % — ABNORMAL LOW (ref 12–46)
MCH: 28.5 pg (ref 26.0–34.0)
MCHC: 30.2 g/dL (ref 30.0–36.0)
MCV: 94.6 fL (ref 78.0–100.0)
MONO ABS: 1 10*3/uL (ref 0.1–1.0)
MONOS PCT: 7 % (ref 3–12)
NEUTROS PCT: 87 % — AB (ref 43–77)
Neutro Abs: 12.5 10*3/uL — ABNORMAL HIGH (ref 1.7–7.7)
Platelets: 181 10*3/uL (ref 150–400)
RBC: 3.12 MIL/uL — AB (ref 4.22–5.81)
RDW: 19.1 % — ABNORMAL HIGH (ref 11.5–15.5)
WBC: 14.4 10*3/uL — ABNORMAL HIGH (ref 4.0–10.5)

## 2015-03-07 LAB — BASIC METABOLIC PANEL
ANION GAP: 9 (ref 5–15)
BUN: 21 mg/dL (ref 6–23)
CO2: 33 mmol/L — ABNORMAL HIGH (ref 19–32)
Calcium: 8.3 mg/dL — ABNORMAL LOW (ref 8.4–10.5)
Chloride: 100 mmol/L (ref 96–112)
Creatinine, Ser: 0.51 mg/dL (ref 0.50–1.35)
GLUCOSE: 183 mg/dL — AB (ref 70–99)
POTASSIUM: 4 mmol/L (ref 3.5–5.1)
SODIUM: 142 mmol/L (ref 135–145)

## 2015-03-07 LAB — CULTURE, BLOOD (ROUTINE X 2): CULTURE: NO GROWTH

## 2015-03-07 LAB — DIGOXIN LEVEL: Digoxin Level: 1 ng/mL (ref 0.8–2.0)

## 2015-03-08 LAB — CBC WITH DIFFERENTIAL/PLATELET
Basophils Absolute: 0 10*3/uL (ref 0.0–0.1)
Basophils Relative: 0 % (ref 0–1)
EOS ABS: 0.1 10*3/uL (ref 0.0–0.7)
EOS PCT: 1 % (ref 0–5)
HCT: 27.5 % — ABNORMAL LOW (ref 39.0–52.0)
Hemoglobin: 8.3 g/dL — ABNORMAL LOW (ref 13.0–17.0)
LYMPHS ABS: 0.6 10*3/uL — AB (ref 0.7–4.0)
Lymphocytes Relative: 5 % — ABNORMAL LOW (ref 12–46)
MCH: 28.5 pg (ref 26.0–34.0)
MCHC: 30.2 g/dL (ref 30.0–36.0)
MCV: 94.5 fL (ref 78.0–100.0)
MONOS PCT: 5 % (ref 3–12)
Monocytes Absolute: 0.5 10*3/uL (ref 0.1–1.0)
NEUTROS PCT: 89 % — AB (ref 43–77)
Neutro Abs: 9.9 10*3/uL — ABNORMAL HIGH (ref 1.7–7.7)
PLATELETS: 183 10*3/uL (ref 150–400)
RBC: 2.91 MIL/uL — ABNORMAL LOW (ref 4.22–5.81)
RDW: 19.2 % — ABNORMAL HIGH (ref 11.5–15.5)
WBC: 11.1 10*3/uL — ABNORMAL HIGH (ref 4.0–10.5)

## 2015-03-08 LAB — CULTURE, BLOOD (ROUTINE X 2)
CULTURE: NO GROWTH
Culture: NO GROWTH

## 2015-03-08 LAB — BASIC METABOLIC PANEL
ANION GAP: 6 (ref 5–15)
BUN: 25 mg/dL — ABNORMAL HIGH (ref 6–23)
CHLORIDE: 97 mmol/L (ref 96–112)
CO2: 38 mmol/L — ABNORMAL HIGH (ref 19–32)
Calcium: 8.5 mg/dL (ref 8.4–10.5)
Creatinine, Ser: 0.44 mg/dL — ABNORMAL LOW (ref 0.50–1.35)
GFR calc non Af Amer: >90 mL/min (ref >90)
Glucose, Bld: 110 mg/dL — ABNORMAL HIGH (ref 70–99)
Potassium: 4.3 mmol/L (ref 3.5–5.1)
SODIUM: 141 mmol/L (ref 135–145)

## 2015-03-09 DIAGNOSIS — I10 Essential (primary) hypertension: Secondary | ICD-10-CM

## 2015-03-09 LAB — CBC
HEMATOCRIT: 29.3 % — AB (ref 39.0–52.0)
HEMOGLOBIN: 9 g/dL — AB (ref 13.0–17.0)
MCH: 28.5 pg (ref 26.0–34.0)
MCHC: 30.7 g/dL (ref 30.0–36.0)
MCV: 92.7 fL (ref 78.0–100.0)
Platelets: 228 10*3/uL (ref 150–400)
RBC: 3.16 MIL/uL — AB (ref 4.22–5.81)
RDW: 18.8 % — AB (ref 11.5–15.5)
WBC: 10.8 10*3/uL — AB (ref 4.0–10.5)

## 2015-03-09 LAB — BASIC METABOLIC PANEL
ANION GAP: 7 (ref 5–15)
BUN: 23 mg/dL (ref 6–23)
CO2: 40 mmol/L (ref 19–32)
Calcium: 8.4 mg/dL (ref 8.4–10.5)
Chloride: 92 mmol/L — ABNORMAL LOW (ref 96–112)
Creatinine, Ser: 0.52 mg/dL (ref 0.50–1.35)
GFR calc Af Amer: >90 mL/min (ref >90)
Glucose, Bld: 126 mg/dL — ABNORMAL HIGH (ref 70–99)
Potassium: 3.7 mmol/L (ref 3.5–5.1)
Sodium: 139 mmol/L (ref 135–145)

## 2015-03-09 LAB — PHOSPHORUS: PHOSPHORUS: 3.9 mg/dL (ref 2.3–4.6)

## 2015-03-09 LAB — MAGNESIUM: Magnesium: 1.8 mg/dL (ref 1.5–2.5)

## 2015-03-09 NOTE — Progress Notes (Signed)
   Subjective: Still some SOB   Objective:   BP 120/70  P 70s    General: Alert, awake, oriented x3, in no acute distress Neck:  JVP is normal Heart: Regular rate and rhythm, without murmurs, rubs, gallops.  Lungs: Decreased airflow bilaterally  Rhonchi   Exemities:  No edema.   Neuro: Grossly intact, nonfocal.   Lab Results: Results for orders placed or performed during the hospital encounter of 07-21-2015 (from the past 24 hour(s))  CBC     Status: Abnormal   Collection Time: 03/09/15  7:35 AM  Result Value Ref Range   WBC 10.8 (H) 4.0 - 10.5 K/uL   RBC 3.16 (L) 4.22 - 5.81 MIL/uL   Hemoglobin 9.0 (L) 13.0 - 17.0 g/dL   HCT 16.129.3 (L) 09.639.0 - 04.552.0 %   MCV 92.7 78.0 - 100.0 fL   MCH 28.5 26.0 - 34.0 pg   MCHC 30.7 30.0 - 36.0 g/dL   RDW 40.918.8 (H) 81.111.5 - 91.415.5 %   Platelets 228 150 - 400 K/uL  Basic metabolic panel     Status: Abnormal   Collection Time: 03/09/15  7:35 AM  Result Value Ref Range   Sodium 139 135 - 145 mmol/L   Potassium 3.7 3.5 - 5.1 mmol/L   Chloride 92 (L) 96 - 112 mmol/L   CO2 40 (HH) 19 - 32 mmol/L   Glucose, Bld 126 (H) 70 - 99 mg/dL   BUN 23 6 - 23 mg/dL   Creatinine, Ser 7.820.52 0.50 - 1.35 mg/dL   Calcium 8.4 8.4 - 95.610.5 mg/dL   GFR calc non Af Amer >90 >90 mL/min   GFR calc Af Amer >90 >90 mL/min   Anion gap 7 5 - 15  Magnesium     Status: None   Collection Time: 03/09/15  7:35 AM  Result Value Ref Range   Magnesium 1.8 1.5 - 2.5 mg/dL  Phosphorus     Status: None   Collection Time: 03/09/15  7:35 AM  Result Value Ref Range   Phosphorus 3.9 2.3 - 4.6 mg/dL    Studies/Results: No results found.  Medications: Reivewed  Cardiac med:  Amio gtt, Dit gtt, metoprolol 25 bid    @PROBHOSP @    1  Atrial fib  Patinet has converted to SR some time yesterday   I would recomm stopping diltiazem and IV amiodarone Switch to amiodarone 200 mg bid Would check LFTs, BNP, TSH   Should be on an anticoagulant  CHA2DS2VASc is 3  Unless contraindicated     Dietrich PatesPaula Ross 03/09/2015, 6:41 PM

## 2015-03-10 LAB — CBC WITH DIFFERENTIAL/PLATELET
BASOS PCT: 0 % (ref 0–1)
Basophils Absolute: 0 10*3/uL (ref 0.0–0.1)
Eosinophils Absolute: 0.1 10*3/uL (ref 0.0–0.7)
Eosinophils Relative: 1 % (ref 0–5)
HCT: 26.1 % — ABNORMAL LOW (ref 39.0–52.0)
Hemoglobin: 8.1 g/dL — ABNORMAL LOW (ref 13.0–17.0)
LYMPHS ABS: 0.7 10*3/uL (ref 0.7–4.0)
Lymphocytes Relative: 7 % — ABNORMAL LOW (ref 12–46)
MCH: 28.5 pg (ref 26.0–34.0)
MCHC: 31 g/dL (ref 30.0–36.0)
MCV: 91.9 fL (ref 78.0–100.0)
MONOS PCT: 5 % (ref 3–12)
Monocytes Absolute: 0.5 10*3/uL (ref 0.1–1.0)
NEUTROS ABS: 8 10*3/uL — AB (ref 1.7–7.7)
NEUTROS PCT: 87 % — AB (ref 43–77)
PLATELETS: 206 10*3/uL (ref 150–400)
RBC: 2.84 MIL/uL — ABNORMAL LOW (ref 4.22–5.81)
RDW: 18.8 % — ABNORMAL HIGH (ref 11.5–15.5)
WBC: 9.2 10*3/uL (ref 4.0–10.5)

## 2015-03-10 LAB — COMPREHENSIVE METABOLIC PANEL
ALT: 79 U/L — AB (ref 0–53)
ANION GAP: 6 (ref 5–15)
AST: 23 U/L (ref 0–37)
Albumin: 2 g/dL — ABNORMAL LOW (ref 3.5–5.2)
Alkaline Phosphatase: 70 U/L (ref 39–117)
BUN: 25 mg/dL — ABNORMAL HIGH (ref 6–23)
CHLORIDE: 94 mmol/L — AB (ref 96–112)
CO2: 40 mmol/L — AB (ref 19–32)
Calcium: 8.3 mg/dL — ABNORMAL LOW (ref 8.4–10.5)
Creatinine, Ser: 0.5 mg/dL (ref 0.50–1.35)
Glucose, Bld: 108 mg/dL — ABNORMAL HIGH (ref 70–99)
Potassium: 3.8 mmol/L (ref 3.5–5.1)
SODIUM: 140 mmol/L (ref 135–145)
Total Bilirubin: 0.6 mg/dL (ref 0.3–1.2)
Total Protein: 4 g/dL — ABNORMAL LOW (ref 6.0–8.3)

## 2015-03-10 LAB — BRAIN NATRIURETIC PEPTIDE: B Natriuretic Peptide: 279.4 pg/mL — ABNORMAL HIGH (ref 0.0–100.0)

## 2015-03-10 LAB — TSH: TSH: 1.91 u[IU]/mL (ref 0.350–4.500)

## 2015-03-10 LAB — MAGNESIUM: Magnesium: 1.7 mg/dL (ref 1.5–2.5)

## 2015-03-10 LAB — PROTIME-INR
INR: 0.97 (ref 0.00–1.49)
Prothrombin Time: 13 seconds (ref 11.6–15.2)

## 2015-03-10 LAB — PHOSPHORUS: PHOSPHORUS: 3.3 mg/dL (ref 2.3–4.6)

## 2015-03-11 LAB — BASIC METABOLIC PANEL
Anion gap: 7 (ref 5–15)
BUN: 23 mg/dL (ref 6–23)
CALCIUM: 8 mg/dL — AB (ref 8.4–10.5)
CHLORIDE: 90 mmol/L — AB (ref 96–112)
CO2: 42 mmol/L (ref 19–32)
Creatinine, Ser: 0.51 mg/dL (ref 0.50–1.35)
GFR calc Af Amer: >90 mL/min (ref >90)
GFR calc non Af Amer: >90 mL/min (ref >90)
GLUCOSE: 124 mg/dL — AB (ref 70–99)
Potassium: 3.3 mmol/L — ABNORMAL LOW (ref 3.5–5.1)
Sodium: 139 mmol/L (ref 135–145)

## 2015-03-11 LAB — CBC WITH DIFFERENTIAL/PLATELET
BASOS PCT: 0 % (ref 0–1)
Basophils Absolute: 0 10*3/uL (ref 0.0–0.1)
EOS PCT: 0 % (ref 0–5)
Eosinophils Absolute: 0 10*3/uL (ref 0.0–0.7)
HEMATOCRIT: 24.9 % — AB (ref 39.0–52.0)
Hemoglobin: 7.9 g/dL — ABNORMAL LOW (ref 13.0–17.0)
LYMPHS ABS: 0.8 10*3/uL (ref 0.7–4.0)
LYMPHS PCT: 8 % — AB (ref 12–46)
MCH: 29.4 pg (ref 26.0–34.0)
MCHC: 31.7 g/dL (ref 30.0–36.0)
MCV: 92.6 fL (ref 78.0–100.0)
MONO ABS: 0.5 10*3/uL (ref 0.1–1.0)
MONOS PCT: 5 % (ref 3–12)
NEUTROS PCT: 87 % — AB (ref 43–77)
Neutro Abs: 9 10*3/uL — ABNORMAL HIGH (ref 1.7–7.7)
PLATELETS: 190 10*3/uL (ref 150–400)
RBC: 2.69 MIL/uL — ABNORMAL LOW (ref 4.22–5.81)
RDW: 19 % — ABNORMAL HIGH (ref 11.5–15.5)
WBC: 10.3 10*3/uL (ref 4.0–10.5)

## 2015-03-11 LAB — PROTIME-INR
INR: 1.04 (ref 0.00–1.49)
Prothrombin Time: 13.7 seconds (ref 11.6–15.2)

## 2015-03-12 LAB — BRAIN NATRIURETIC PEPTIDE: B NATRIURETIC PEPTIDE 5: 409.9 pg/mL — AB (ref 0.0–100.0)

## 2015-03-12 LAB — CBC WITH DIFFERENTIAL/PLATELET
BASOS ABS: 0.1 10*3/uL (ref 0.0–0.1)
Basophils Relative: 1 % (ref 0–1)
EOS ABS: 0 10*3/uL (ref 0.0–0.7)
Eosinophils Relative: 0 % (ref 0–5)
HCT: 26.7 % — ABNORMAL LOW (ref 39.0–52.0)
HEMOGLOBIN: 8.4 g/dL — AB (ref 13.0–17.0)
LYMPHS PCT: 7 % — AB (ref 12–46)
Lymphs Abs: 0.8 10*3/uL (ref 0.7–4.0)
MCH: 29 pg (ref 26.0–34.0)
MCHC: 31.5 g/dL (ref 30.0–36.0)
MCV: 92.1 fL (ref 78.0–100.0)
Monocytes Absolute: 0.5 10*3/uL (ref 0.1–1.0)
Monocytes Relative: 4 % (ref 3–12)
Neutro Abs: 9 10*3/uL — ABNORMAL HIGH (ref 1.7–7.7)
Neutrophils Relative %: 88 % — ABNORMAL HIGH (ref 43–77)
Platelets: 156 10*3/uL (ref 150–400)
RBC: 2.9 MIL/uL — ABNORMAL LOW (ref 4.22–5.81)
RDW: 18.9 % — ABNORMAL HIGH (ref 11.5–15.5)
WBC: 10.3 10*3/uL (ref 4.0–10.5)

## 2015-03-12 LAB — BASIC METABOLIC PANEL
Anion gap: 7 (ref 5–15)
BUN: 18 mg/dL (ref 6–23)
CALCIUM: 8.1 mg/dL — AB (ref 8.4–10.5)
CO2: 40 mmol/L (ref 19–32)
Chloride: 93 mmol/L — ABNORMAL LOW (ref 96–112)
Creatinine, Ser: 0.52 mg/dL (ref 0.50–1.35)
GFR calc Af Amer: >90 mL/min (ref >90)
GFR calc non Af Amer: >90 mL/min (ref >90)
GLUCOSE: 98 mg/dL (ref 70–99)
POTASSIUM: 2.6 mmol/L — AB (ref 3.5–5.1)
SODIUM: 140 mmol/L (ref 135–145)

## 2015-03-12 LAB — PROTIME-INR
INR: 1.21 (ref 0.00–1.49)
PROTHROMBIN TIME: 15.4 s — AB (ref 11.6–15.2)

## 2015-03-12 LAB — MAGNESIUM: Magnesium: 1.7 mg/dL (ref 1.5–2.5)

## 2015-03-12 LAB — POTASSIUM: POTASSIUM: 3.3 mmol/L — AB (ref 3.5–5.1)

## 2015-03-12 LAB — PHOSPHORUS: PHOSPHORUS: 3.4 mg/dL (ref 2.3–4.6)

## 2015-03-13 ENCOUNTER — Institutional Professional Consult (permissible substitution) (HOSPITAL_COMMUNITY): Payer: Self-pay

## 2015-03-13 DIAGNOSIS — J810 Acute pulmonary edema: Secondary | ICD-10-CM

## 2015-03-13 DIAGNOSIS — J9621 Acute and chronic respiratory failure with hypoxia: Secondary | ICD-10-CM

## 2015-03-13 DIAGNOSIS — T8182XA Emphysema (subcutaneous) resulting from a procedure, initial encounter: Secondary | ICD-10-CM

## 2015-03-13 LAB — CBC
HCT: 28 % — ABNORMAL LOW (ref 39.0–52.0)
Hemoglobin: 8.7 g/dL — ABNORMAL LOW (ref 13.0–17.0)
MCH: 29 pg (ref 26.0–34.0)
MCHC: 31.1 g/dL (ref 30.0–36.0)
MCV: 93.3 fL (ref 78.0–100.0)
PLATELETS: 171 10*3/uL (ref 150–400)
RBC: 3 MIL/uL — ABNORMAL LOW (ref 4.22–5.81)
RDW: 18.9 % — ABNORMAL HIGH (ref 11.5–15.5)
WBC: 11.5 10*3/uL — ABNORMAL HIGH (ref 4.0–10.5)

## 2015-03-13 LAB — BASIC METABOLIC PANEL
Anion gap: 6 (ref 5–15)
BUN: 19 mg/dL (ref 6–23)
CALCIUM: 8.2 mg/dL — AB (ref 8.4–10.5)
CO2: 40 mmol/L — AB (ref 19–32)
Chloride: 94 mmol/L — ABNORMAL LOW (ref 96–112)
Creatinine, Ser: 0.52 mg/dL (ref 0.50–1.35)
GFR calc Af Amer: >90 mL/min (ref >90)
GFR calc non Af Amer: >90 mL/min (ref >90)
Glucose, Bld: 95 mg/dL (ref 70–99)
Potassium: 3 mmol/L — ABNORMAL LOW (ref 3.5–5.1)
SODIUM: 140 mmol/L (ref 135–145)

## 2015-03-13 LAB — PROTIME-INR
INR: 1.22 (ref 0.00–1.49)
PROTHROMBIN TIME: 15.5 s — AB (ref 11.6–15.2)

## 2015-03-13 LAB — POTASSIUM: Potassium: 3.6 mmol/L (ref 3.5–5.1)

## 2015-03-13 NOTE — Consult Note (Signed)
HPI: [id number] M with complex recent medical history starting with [facility] and [facility]. His course has been complicated by prolonged hospitalization, refractory hypoxemia, MRSA bacteremia, severe debilitation. [facility]. In the past week, he had a setback with AFRVR and resultant pulm edema. He has remained on high flow O2 by [place]. PCCM consulted to address refractory hypoxemia   PMH: Htn CAD COPD Chronic pain Lung cancer - records indicate small cell ca but he did undergo thoracotomy on R for this L thoracotomy for benign nodule  SH: Was smoking 1 PPD prior to current hospitalization No sig occupational exposures Lived semi-independently PTA  FH: N/C  ROS: a detailed review indicates no CP, angina, hemoptysis, abd pain, dysuria. He has improving symmetric ankle edema  EXAM: Vitals reviewed On FiO2 80% by high flow [place] HEENT WNL No JVD noted Markedly diminished BS, no wheezes or bronchophony Moderately obese, soft, NT, +BS 2+ symmetric ankle and pedal edema  I have reviewed all of today's lab results. Relevant abnormalities are discussed in the A/P section   CT chest 4/16: s/p RUL resection, very severe emphysema, Lingular consolidation. 18 mm left upper lobe nodular density. Left supraclavicular lymphadenopathy  CXR 4/26: emphysema, resolved edema, improving lingular density   IMPRESSION: Acute on chronic hypoxic respiratory failure - multifactorial  S/P RUL lobectomy  Very severe emphysema - no wheezing  Pulm edema - essentially resolved by CXR 4/26  Resolving MRSA PNA  AFRVR > NSR after amiodarone  Profound debilitation/deconditioning  PLAN/RECS: There is little to add to his excellent mgmt Cont O2 to maintain SpO2 > 88% Cont inhaled steroids/LABA combination Cont PRN albuterol Diuresis to maintain I/Os even to slightly negative  Monitor chem panels closely Abx therapy per ID service Rest of  medical care per primary service It would be appropriate to address advanced directives. With so little margin for improvement and after such a protracted hospitalization, he would not likely have a favorable outcome after ACLS or intubation  I do have concern about use of amiodarone with its potential lung toxicity in this pt as he has no reserve and is on high FiO2 which might potentiate lung toxicity. However, he was very poorly tolerant of AFRVR so risk/benefit seems acceptable if there are no alternative anti-arrhythmic agents  Billy Fischeravid Meoshia Billing, MD ; [facility] Mobile [phone].  After 5:30 PM or weekends, call [phone]

## 2015-03-14 LAB — BASIC METABOLIC PANEL
ANION GAP: 4 — AB (ref 5–15)
BUN: 17 mg/dL (ref 6–23)
CALCIUM: 7.9 mg/dL — AB (ref 8.4–10.5)
CHLORIDE: 95 mmol/L — AB (ref 96–112)
CO2: 41 mmol/L (ref 19–32)
Creatinine, Ser: 0.47 mg/dL — ABNORMAL LOW (ref 0.50–1.35)
Glucose, Bld: 91 mg/dL (ref 70–99)
Potassium: 2.9 mmol/L — ABNORMAL LOW (ref 3.5–5.1)
Sodium: 140 mmol/L (ref 135–145)

## 2015-03-14 LAB — PROTIME-INR
INR: 1.65 — ABNORMAL HIGH (ref 0.00–1.49)
Prothrombin Time: 19.6 seconds — ABNORMAL HIGH (ref 11.6–15.2)

## 2015-03-14 LAB — MAGNESIUM: Magnesium: 1.7 mg/dL (ref 1.5–2.5)

## 2015-03-15 LAB — CBC WITH DIFFERENTIAL/PLATELET
Basophils Absolute: 0 10*3/uL (ref 0.0–0.1)
Basophils Relative: 0 % (ref 0–1)
Eosinophils Absolute: 0 10*3/uL (ref 0.0–0.7)
HCT: 22.7 % — ABNORMAL LOW (ref 39.0–52.0)
Hemoglobin: 7.1 g/dL — ABNORMAL LOW (ref 13.0–17.0)
Lymphocytes Relative: 7 % — ABNORMAL LOW (ref 12–46)
Lymphs Abs: 0.6 10*3/uL — ABNORMAL LOW (ref 0.7–4.0)
MCH: 29.1 pg (ref 26.0–34.0)
MCHC: 31.3 g/dL (ref 30.0–36.0)
MCV: 93 fL (ref 78.0–100.0)
MONO ABS: 0.5 10*3/uL (ref 0.1–1.0)
Monocytes Relative: 6 % (ref 3–12)
Neutro Abs: 7.6 10*3/uL (ref 1.7–7.7)
Neutrophils Relative %: 87 % — ABNORMAL HIGH (ref 43–77)
PLATELETS: 129 10*3/uL — AB (ref 150–400)
RBC: 2.44 MIL/uL — ABNORMAL LOW (ref 4.22–5.81)
RDW: 20 % — AB (ref 11.5–15.5)
WBC: 8.7 10*3/uL (ref 4.0–10.5)

## 2015-03-15 LAB — BASIC METABOLIC PANEL
ANION GAP: 6 (ref 5–15)
BUN: 12 mg/dL (ref 6–23)
CO2: 43 mmol/L — AB (ref 19–32)
CREATININE: 0.48 mg/dL — AB (ref 0.50–1.35)
Calcium: 8 mg/dL — ABNORMAL LOW (ref 8.4–10.5)
Chloride: 93 mmol/L — ABNORMAL LOW (ref 96–112)
GFR calc Af Amer: >90 mL/min (ref >90)
GFR calc non Af Amer: >90 mL/min (ref >90)
Glucose, Bld: 137 mg/dL — ABNORMAL HIGH (ref 70–99)
Potassium: 3.1 mmol/L — ABNORMAL LOW (ref 3.5–5.1)
Sodium: 142 mmol/L (ref 135–145)

## 2015-03-15 LAB — PROTIME-INR
INR: 1.88 — ABNORMAL HIGH (ref 0.00–1.49)
Prothrombin Time: 21.8 seconds — ABNORMAL HIGH (ref 11.6–15.2)

## 2015-03-16 ENCOUNTER — Other Ambulatory Visit (HOSPITAL_COMMUNITY): Payer: Self-pay

## 2015-03-16 LAB — BLOOD GAS, ARTERIAL
Acid-Base Excess: 22.1 mmol/L — ABNORMAL HIGH (ref 0.0–2.0)
BICARBONATE: 47.5 meq/L — AB (ref 20.0–24.0)
Delivery systems: POSITIVE
Expiratory PAP: 8
FIO2: 0.7 %
Inspiratory PAP: 18
O2 SAT: 98.2 %
PO2 ART: 93.8 mmHg (ref 80.0–100.0)
Patient temperature: 97.6
TCO2: 49.4 mmol/L (ref 0–100)
pCO2 arterial: 60.2 mmHg (ref 35.0–45.0)
pH, Arterial: 7.506 — ABNORMAL HIGH (ref 7.350–7.450)

## 2015-03-16 LAB — CBC WITH DIFFERENTIAL/PLATELET
BASOS ABS: 0.1 10*3/uL (ref 0.0–0.1)
Basophils Relative: 1 % (ref 0–1)
EOS PCT: 1 % (ref 0–5)
Eosinophils Absolute: 0.1 10*3/uL (ref 0.0–0.7)
HEMATOCRIT: 24.3 % — AB (ref 39.0–52.0)
Hemoglobin: 7.5 g/dL — ABNORMAL LOW (ref 13.0–17.0)
LYMPHS ABS: 0.9 10*3/uL (ref 0.7–4.0)
LYMPHS PCT: 9 % — AB (ref 12–46)
MCH: 29.5 pg (ref 26.0–34.0)
MCHC: 30.9 g/dL (ref 30.0–36.0)
MCV: 95.7 fL (ref 78.0–100.0)
Monocytes Absolute: 0.8 10*3/uL (ref 0.1–1.0)
Monocytes Relative: 8 % (ref 3–12)
NEUTROS PCT: 83 % — AB (ref 43–77)
Neutro Abs: 8.4 10*3/uL — ABNORMAL HIGH (ref 1.7–7.7)
Platelets: 120 10*3/uL — ABNORMAL LOW (ref 150–400)
RBC: 2.54 MIL/uL — AB (ref 4.22–5.81)
RDW: 20.9 % — ABNORMAL HIGH (ref 11.5–15.5)
WBC: 10.2 10*3/uL (ref 4.0–10.5)

## 2015-03-16 LAB — BASIC METABOLIC PANEL
ANION GAP: 7 (ref 5–15)
BUN: 10 mg/dL (ref 6–23)
CHLORIDE: 87 mmol/L — AB (ref 96–112)
CO2: 47 mmol/L — AB (ref 19–32)
CREATININE: 0.64 mg/dL (ref 0.50–1.35)
Calcium: 7.7 mg/dL — ABNORMAL LOW (ref 8.4–10.5)
GFR calc non Af Amer: >90 mL/min (ref >90)
GLUCOSE: 140 mg/dL — AB (ref 70–99)
POTASSIUM: 2.5 mmol/L — AB (ref 3.5–5.1)
Sodium: 141 mmol/L (ref 135–145)

## 2015-03-16 LAB — PROTIME-INR
INR: 2.18 — AB (ref 0.00–1.49)
PROTHROMBIN TIME: 24.5 s — AB (ref 11.6–15.2)

## 2015-03-16 LAB — POTASSIUM: POTASSIUM: 2.6 mmol/L — AB (ref 3.5–5.1)

## 2015-03-16 LAB — HEMOGLOBIN AND HEMATOCRIT, BLOOD
HCT: 21.5 % — ABNORMAL LOW (ref 39.0–52.0)
HEMATOCRIT: 23.6 % — AB (ref 39.0–52.0)
Hemoglobin: 6.7 g/dL — CL (ref 13.0–17.0)
Hemoglobin: 7.3 g/dL — ABNORMAL LOW (ref 13.0–17.0)

## 2015-03-17 ENCOUNTER — Other Ambulatory Visit (HOSPITAL_COMMUNITY): Payer: Self-pay

## 2015-03-17 LAB — BASIC METABOLIC PANEL
BUN: 14 mg/dL (ref 6–23)
CHLORIDE: 85 mmol/L — AB (ref 96–112)
CO2: >50 mmol/L (ref 19–32)
Calcium: 8.1 mg/dL — ABNORMAL LOW (ref 8.4–10.5)
Creatinine, Ser: 0.5 mg/dL (ref 0.50–1.35)
GFR calc non Af Amer: >90 mL/min (ref >90)
GLUCOSE: 130 mg/dL — AB (ref 70–99)
POTASSIUM: 3.1 mmol/L — AB (ref 3.5–5.1)
Sodium: 142 mmol/L (ref 135–145)

## 2015-03-17 LAB — RENAL FUNCTION PANEL
ANION GAP: 0 — AB (ref 5–15)
Albumin: 2 g/dL — ABNORMAL LOW (ref 3.5–5.2)
BUN: 12 mg/dL (ref 6–23)
CO2: 45 mmol/L (ref 19–32)
Calcium: 7.2 mg/dL — ABNORMAL LOW (ref 8.4–10.5)
Chloride: 93 mmol/L — ABNORMAL LOW (ref 96–112)
Creatinine, Ser: 0.39 mg/dL — ABNORMAL LOW (ref 0.50–1.35)
Glucose, Bld: 99 mg/dL (ref 70–99)
PHOSPHORUS: 3.4 mg/dL (ref 2.3–4.6)
Potassium: >7.5 mmol/L (ref 3.5–5.1)
SODIUM: 134 mmol/L — AB (ref 135–145)

## 2015-03-17 LAB — CBC WITH DIFFERENTIAL/PLATELET
BASOS ABS: 0 10*3/uL (ref 0.0–0.1)
BASOS PCT: 0 % (ref 0–1)
EOS ABS: 0.1 10*3/uL (ref 0.0–0.7)
Eosinophils Relative: 1 % (ref 0–5)
HCT: 22 % — ABNORMAL LOW (ref 39.0–52.0)
HEMOGLOBIN: 6.6 g/dL — AB (ref 13.0–17.0)
Lymphocytes Relative: 8 % — ABNORMAL LOW (ref 12–46)
Lymphs Abs: 0.7 10*3/uL (ref 0.7–4.0)
MCH: 29.2 pg (ref 26.0–34.0)
MCHC: 30 g/dL (ref 30.0–36.0)
MCV: 97.3 fL (ref 78.0–100.0)
Monocytes Absolute: 0.6 10*3/uL (ref 0.1–1.0)
Monocytes Relative: 7 % (ref 3–12)
NEUTROS ABS: 6.7 10*3/uL (ref 1.7–7.7)
NEUTROS PCT: 84 % — AB (ref 43–77)
Platelets: 113 10*3/uL — ABNORMAL LOW (ref 150–400)
RBC: 2.26 MIL/uL — ABNORMAL LOW (ref 4.22–5.81)
RDW: 21.3 % — AB (ref 11.5–15.5)
WBC: 8.1 10*3/uL (ref 4.0–10.5)

## 2015-03-17 LAB — CBC
HCT: 32.8 % — ABNORMAL LOW (ref 39.0–52.0)
Hemoglobin: 10.1 g/dL — ABNORMAL LOW (ref 13.0–17.0)
MCH: 29.4 pg (ref 26.0–34.0)
MCHC: 30.8 g/dL (ref 30.0–36.0)
MCV: 95.6 fL (ref 78.0–100.0)
PLATELETS: 113 10*3/uL — AB (ref 150–400)
RBC: 3.43 MIL/uL — ABNORMAL LOW (ref 4.22–5.81)
RDW: 21.3 % — AB (ref 11.5–15.5)
WBC: 8 10*3/uL (ref 4.0–10.5)

## 2015-03-17 LAB — PROTIME-INR
INR: 2.41 — ABNORMAL HIGH (ref 0.00–1.49)
Prothrombin Time: 26.4 seconds — ABNORMAL HIGH (ref 11.6–15.2)

## 2015-03-17 LAB — BRAIN NATRIURETIC PEPTIDE: B Natriuretic Peptide: 235.5 pg/mL — ABNORMAL HIGH (ref 0.0–100.0)

## 2015-03-18 LAB — BASIC METABOLIC PANEL
BUN: 14 mg/dL (ref 6–20)
CALCIUM: 8 mg/dL — AB (ref 8.9–10.3)
CHLORIDE: 85 mmol/L — AB (ref 101–111)
CO2: >50 mmol/L — ABNORMAL HIGH (ref 22–32)
Creatinine, Ser: 0.45 mg/dL — ABNORMAL LOW (ref 0.61–1.24)
GFR calc non Af Amer: >60 mL/min (ref >60)
Glucose, Bld: 88 mg/dL (ref 70–99)
Potassium: 2.8 mmol/L — ABNORMAL LOW (ref 3.5–5.1)
Sodium: 141 mmol/L (ref 135–145)

## 2015-03-18 LAB — MAGNESIUM: Magnesium: 1.5 mg/dL — ABNORMAL LOW (ref 1.7–2.4)

## 2015-03-18 LAB — PROTIME-INR
INR: 2.2 — AB (ref 0.00–1.49)
Prothrombin Time: 24.6 seconds — ABNORMAL HIGH (ref 11.6–15.2)

## 2015-03-19 LAB — CBC
HCT: 23.3 % — ABNORMAL LOW (ref 39.0–52.0)
HEMOGLOBIN: 7 g/dL — AB (ref 13.0–17.0)
MCH: 29.5 pg (ref 26.0–34.0)
MCHC: 30 g/dL (ref 30.0–36.0)
MCV: 98.3 fL (ref 78.0–100.0)
Platelets: 147 10*3/uL — ABNORMAL LOW (ref 150–400)
RBC: 2.37 MIL/uL — ABNORMAL LOW (ref 4.22–5.81)
RDW: 22.8 % — ABNORMAL HIGH (ref 11.5–15.5)
WBC: 11.5 10*3/uL — AB (ref 4.0–10.5)

## 2015-03-19 LAB — CBC WITH DIFFERENTIAL/PLATELET
Basophils Absolute: 0.1 10*3/uL (ref 0.0–0.1)
Basophils Relative: 1 % (ref 0–1)
EOS PCT: 2 % (ref 0–5)
Eosinophils Absolute: 0.2 10*3/uL (ref 0.0–0.7)
HEMATOCRIT: 20.9 % — AB (ref 39.0–52.0)
Hemoglobin: 6.3 g/dL — CL (ref 13.0–17.0)
Lymphocytes Relative: 12 % (ref 12–46)
Lymphs Abs: 1.1 10*3/uL (ref 0.7–4.0)
MCH: 30 pg (ref 26.0–34.0)
MCHC: 30.1 g/dL (ref 30.0–36.0)
MCV: 99.5 fL (ref 78.0–100.0)
MONO ABS: 1.1 10*3/uL — AB (ref 0.1–1.0)
MONOS PCT: 13 % — AB (ref 3–12)
Neutro Abs: 6.3 10*3/uL (ref 1.7–7.7)
Neutrophils Relative %: 72 % (ref 43–77)
Platelets: 123 10*3/uL — ABNORMAL LOW (ref 150–400)
RBC: 2.1 MIL/uL — ABNORMAL LOW (ref 4.22–5.81)
RDW: 22.9 % — ABNORMAL HIGH (ref 11.5–15.5)
WBC: 8.8 10*3/uL (ref 4.0–10.5)

## 2015-03-19 LAB — COMPREHENSIVE METABOLIC PANEL
ALT: 54 U/L (ref 17–63)
AST: 22 U/L (ref 15–41)
Albumin: 2.2 g/dL — ABNORMAL LOW (ref 3.5–5.0)
Alkaline Phosphatase: 62 U/L (ref 38–126)
Anion gap: 4 — ABNORMAL LOW (ref 5–15)
BUN: 15 mg/dL (ref 6–20)
CALCIUM: 7.8 mg/dL — AB (ref 8.9–10.3)
CHLORIDE: 90 mmol/L — AB (ref 101–111)
CO2: 49 mmol/L — AB (ref 22–32)
Creatinine, Ser: 0.43 mg/dL — ABNORMAL LOW (ref 0.61–1.24)
GFR calc Af Amer: >60 mL/min (ref >60)
GFR calc non Af Amer: >60 mL/min (ref >60)
Glucose, Bld: 87 mg/dL (ref 70–99)
Potassium: 3 mmol/L — ABNORMAL LOW (ref 3.5–5.1)
Sodium: 143 mmol/L (ref 135–145)
TOTAL PROTEIN: 4.7 g/dL — AB (ref 6.5–8.1)
Total Bilirubin: 0.6 mg/dL (ref 0.3–1.2)

## 2015-03-19 LAB — PREPARE RBC (CROSSMATCH)

## 2015-03-19 LAB — PROTIME-INR
INR: 2.62 — ABNORMAL HIGH (ref 0.00–1.49)
PROTHROMBIN TIME: 28.2 s — AB (ref 11.6–15.2)

## 2015-03-19 NOTE — Consult Note (Signed)
Duplicate

## 2015-03-20 ENCOUNTER — Other Ambulatory Visit (HOSPITAL_COMMUNITY): Payer: Self-pay

## 2015-03-20 DIAGNOSIS — J441 Chronic obstructive pulmonary disease with (acute) exacerbation: Secondary | ICD-10-CM

## 2015-03-20 DIAGNOSIS — J189 Pneumonia, unspecified organism: Secondary | ICD-10-CM | POA: Insufficient documentation

## 2015-03-20 DIAGNOSIS — J9601 Acute respiratory failure with hypoxia: Secondary | ICD-10-CM

## 2015-03-20 DIAGNOSIS — Z01818 Encounter for other preprocedural examination: Secondary | ICD-10-CM | POA: Insufficient documentation

## 2015-03-20 DIAGNOSIS — J80 Acute respiratory distress syndrome: Secondary | ICD-10-CM

## 2015-03-20 LAB — BASIC METABOLIC PANEL
Anion gap: 10 (ref 5–15)
BUN: 15 mg/dL (ref 6–20)
CHLORIDE: 84 mmol/L — AB (ref 101–111)
CO2: 45 mmol/L — ABNORMAL HIGH (ref 22–32)
Calcium: 7.9 mg/dL — ABNORMAL LOW (ref 8.9–10.3)
Creatinine, Ser: 0.56 mg/dL — ABNORMAL LOW (ref 0.61–1.24)
GFR calc Af Amer: >60 mL/min (ref >60)
GFR calc non Af Amer: >60 mL/min (ref >60)
Glucose, Bld: 131 mg/dL — ABNORMAL HIGH (ref 70–99)
POTASSIUM: 2.1 mmol/L — AB (ref 3.5–5.1)
SODIUM: 139 mmol/L (ref 135–145)

## 2015-03-20 LAB — PROTIME-INR
INR: 2.51 — AB (ref 0.00–1.49)
PROTHROMBIN TIME: 27.3 s — AB (ref 11.6–15.2)

## 2015-03-20 LAB — HEMOGLOBIN AND HEMATOCRIT, BLOOD
HEMATOCRIT: 32.5 % — AB (ref 39.0–52.0)
HEMOGLOBIN: 10.4 g/dL — AB (ref 13.0–17.0)

## 2015-03-20 LAB — ABO/RH: ABO/RH(D): A NEG

## 2015-03-20 LAB — BRAIN NATRIURETIC PEPTIDE: B Natriuretic Peptide: 255.3 pg/mL — ABNORMAL HIGH (ref 0.0–100.0)

## 2015-03-21 LAB — TYPE AND SCREEN
ABO/RH(D): A NEG
Antibody Screen: NEGATIVE
Unit division: 0
Unit division: 0

## 2015-04-18 NOTE — Procedures (Signed)
Intubation Procedure Note Robert OharaJohn Baird [id number] 1951/11/16  Procedure: Intubation Indications: Respiratory insufficiency  Procedure Details Consent: Unable to obtain consent because of emergent medical necessity. Time Out: Verified patient identification, verified procedure, site/side was marked, verified correct patient position, special equipment/implants available, medications/allergies/relevent history reviewed, required imaging and test results available.  Performed  Maximum sterile technique was used including antiseptics, cap, gloves, gown, hand hygiene, mask and sheet.  MAC 3 glidescope. Placement confirmed by direct vis, ETCO2, auscultation  Of note bloody secretions obtained on tube placement.     Evaluation Hemodynamic Status: BP stable throughout; O2 sats: stable throughout Patient's Current Condition: stable Complications: Pt had emesis during pre-oxygenation. Suctioned clear quickly.  Patient did tolerate procedure well. Chest X-ray ordered to verify placement.  CXR: pending.   Robert RoachPaul Baird, AGACNP-BC [place] [facility] Pager [phone] or [phone] 04/04/2015 3:09 PM   [name], MD, PhD 03/19/2015, 3:17 PM [facility] [phone] or if no answer [phone]

## 2015-04-18 NOTE — Progress Notes (Signed)
HPI: [id number] M with complex recent medical history starting with [facility] [place] [facility] and [facility]. His course has been complicated by prolonged hospitalization, refractory hypoxemia, MRSA bacteremia, severe debilitation. [facility] In the past week, he had a setback with AFRVR and resultant pulm edema. He has remained on high flow O2 by [place]. PCCM consulted to address refractory hypoxemia   PMH: Htn CAD COPD Chronic pain Lung cancer - records indicate small cell ca but he did undergo thoracotomy on R for this L thoracotomy for benign nodule  SH: Was smoking 1 PPD prior to current hospitalization No sig occupational exposures Lived semi-independently PTA  FH: N/C  ROS: a detailed review indicates no CP, angina, hemoptysis, abd pain, dysuria. He has improving symmetric ankle edema  EXAM: Vitals reviewed On FiO2 80% by high flow [place] HEENT WNL No JVD noted Markedly diminished BS, no wheezes or bronchophony Moderately obese, soft, NT, +BS 2+ symmetric ankle and pedal edema  I have reviewed all of today's lab results. Relevant abnormalities are discussed in the A/P section   CT chest 4/16: s/p RUL resection, very severe emphysema, Lingular consolidation. 18 mm left upper lobe nodular density. Left supraclavicular lymphadenopathy  CXR 4/26: emphysema, resolved edema, improving lingular density   IMPRESSION: Acute on chronic hypoxic respiratory failure - multifactorial  S/P RUL lobectomy  Very severe emphysema - no wheezing  Pulm edema - essentially resolved by CXR 4/26  Resolving MRSA PNA  AFRVR > NSR after amiodarone  Profound debilitation/deconditioning  PLAN/RECS: Cont O2 to maintain SpO2 > 88% Cont inhaled steroids/LABA combination Cont PRN albuterol Diuresis to maintain I/Os even to slightly negative Monitor chem panels closely Abx therapy per ID service Rest of medical care per primary service  Per previous  conversation, the patient has opted to be full code with full treatment.  He is clearly deteriorating and I am concerned that he will continue to do so.  I spoke with patient bedside at length today.  I described what intubation would entail with subsequent trach/peg.  He was sure he would not want long term support and would not want CPR/Cardioversion.  But when time, he would like to be intubated until his family gets here then will terminally extubate.  I discussed this with SSH-MD and SSH-NP.  After discussion, will call family for a meeting on Thursday and will likely proceed with comfort from there as over the phone the son favored comfort at this point.   PCCM will see again on Thursday.  The patient is critically ill with multiple organ systems failure and requires high complexity decision making for assessment and support, frequent evaluation and titration of therapies, application of advanced monitoring technologies and extensive interpretation of multiple databases.   Critical Care Time devoted to patient care services described in this note is  35  Minutes. This time reflects time of care of this signee Dr Koren BoundWesam Eurika Sandy. This critical care time does not reflect procedure time, or teaching time or supervisory time of PA/NP/Med student/Med Resident etc but could involve care discussion time.  Alyson ReedyWesam G. Jodie Leiner, M.D. [facility]. Pager: [phone]. After hours pager: [phone].

## 2015-04-18 DEATH — deceased

## 2016-04-25 IMAGING — CR DG CHEST 1V PORT
1 series · 1 of 1 positions shown · non-contrast
Comparison: None.

CLINICAL DATA: Respiratory failure.

EXAM:
PORTABLE CHEST - 1 VIEW

[AP]
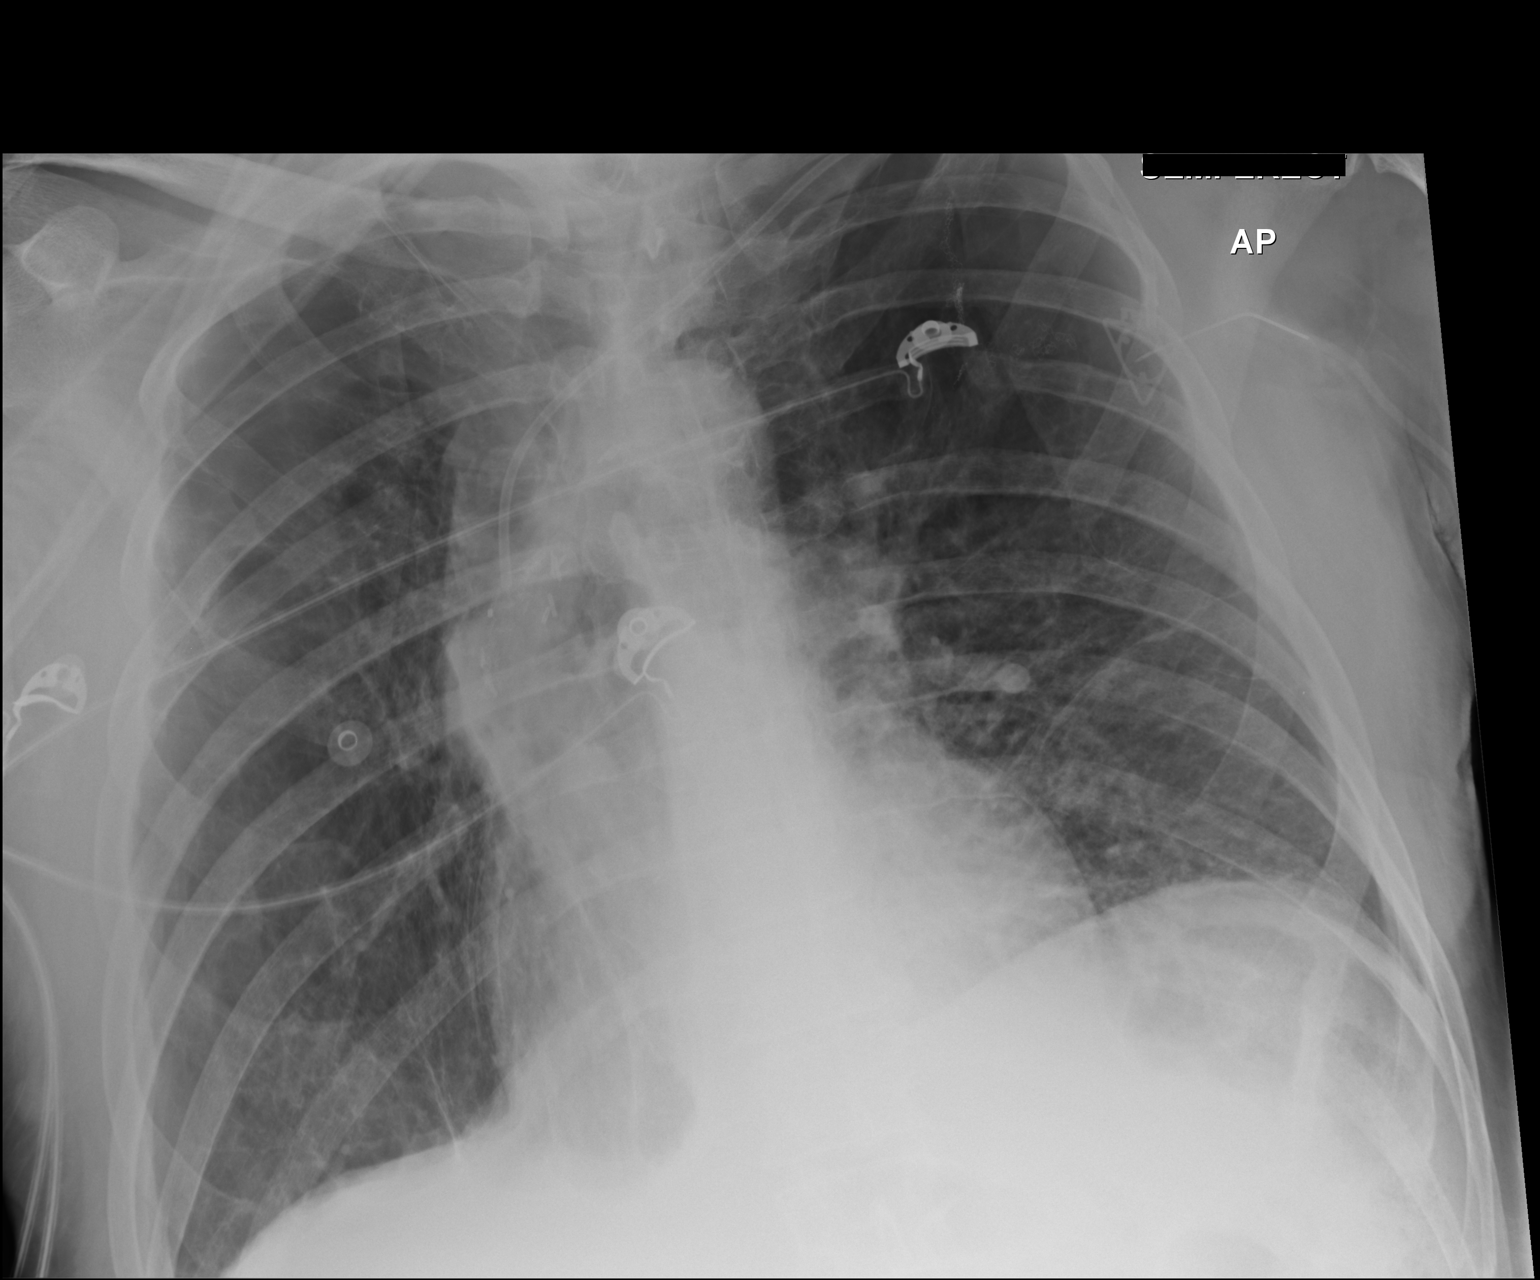

[1 of 1 positions shown; findings below may reference images not displayed]

FINDINGS: Power port CT in good anatomic position. Mediastinum structures are
normal. Postsurgical changes right lung with surgical clips in the
right hilum. Postsurgical changes left upper lobe a surgical sutures
in the left upper lobe. Basilar subsegmental atelectasis and/or
pleural parenchymal scarring. Changes of bullous COPD particularly
in the apices. Mild infiltrate left lung base cannot be excluded. No
definite pneumothorax. Severe bullous changes and pleural
parenchymal thickening in the apices make evaluation for subtle
pneumothorax in the apices difficult. Attention to the pulmonary
apices on follow-up chest x-rays suggested. No pleural effusion. No
acute osseous abnormality.
IMPRESSION: 1. Power port catheter in good anatomic position.
2. Right perihilar and left upper lobe postsurgical changes. COPD
with prominent bulla and pleural parenchymal thickening consistent
with scarring in the apices. No definite pneumothorax is identified.
Subtle pneumothorax in the apices would be difficult to detect due
to the bullous change and pleural parenchymal scarring. Attention to
the pulmonary apices on subsequent chest x-rays suggested.
3. Bibasilar subsegmental atelectasis with mild infiltrate left
lower lobe. Follow-up chest x-rays to demonstrate clearing
suggested.

## 2016-05-12 IMAGING — CR DG CHEST 1V PORT
1 series · 1 of 1 positions shown · non-contrast
Comparison: 03/01/2015

CLINICAL DATA: Left-sided pleuritic chest pain. Shortness of
breath. COPD. Pneumonia.

EXAM:
PORTABLE CHEST - 1 VIEW

[AP]
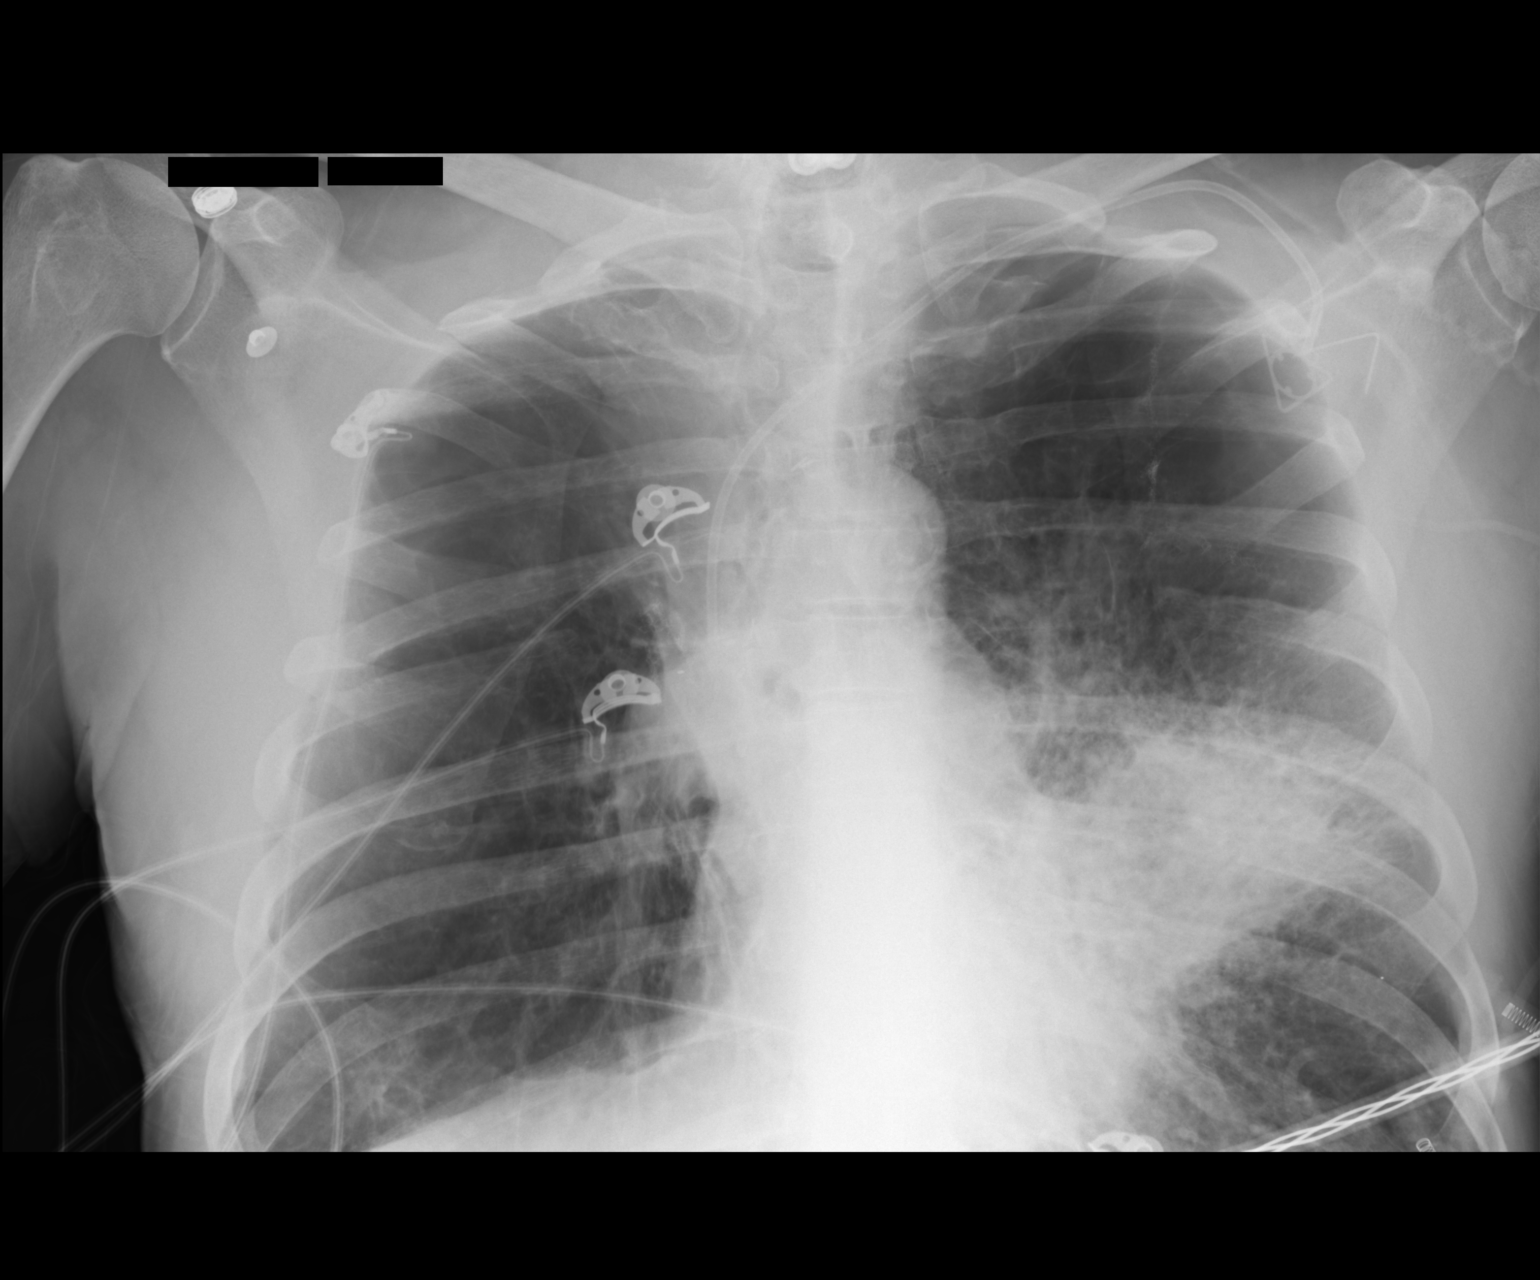

[1 of 1 positions shown; findings below may reference images not displayed]

FINDINGS: Increased consolidation is seen in the left mid and lower lung field
compared to previous study. Underlying pulmonary emphysema noted. No
evidence of pneumothorax or pleural effusion. Left-sided power port
remains in appropriate position. Heart size remains normal.
IMPRESSION: Increased consolidation in left mid and lower lung field, consistent
with pneumonia.

Emphysema.

## 2016-05-17 IMAGING — CR DG CHEST 1V PORT
1 series · 1 of 1 positions shown · non-contrast
Comparison: 03/03/2015; 03/02/2015; 02/13/2015; chest CT -
03/03/2015

CLINICAL DATA: Pneumonia

EXAM:
PORTABLE CHEST - 1 VIEW

[AP]
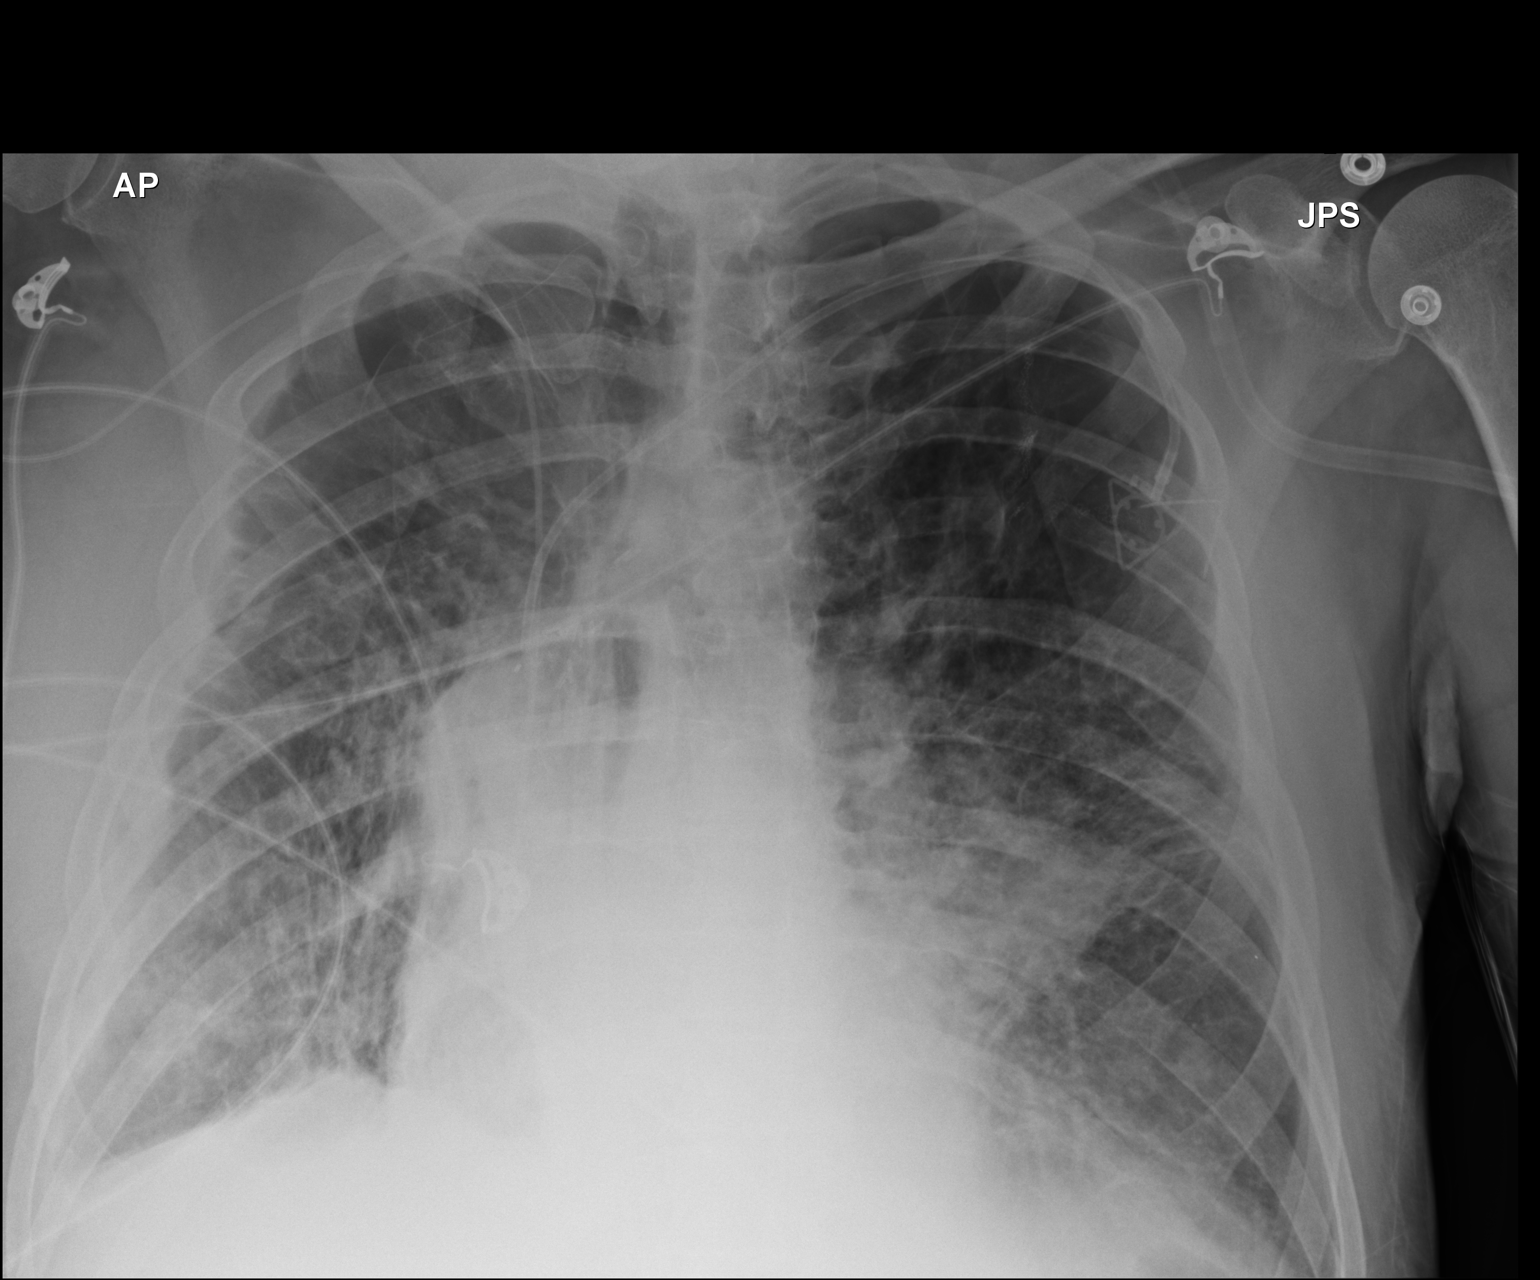

[1 of 1 positions shown; findings below may reference images not displayed]

FINDINGS: Grossly unchanged cardiac silhouette and mediastinal contours with
persistent obscuration of the left heart border secondary to known
consolidative masslike opacity. Stable positioning of support
apparatus. Pulmonary vasculature appears less distinct with
cephalization of flow. Re- demonstrated advanced emphysematous
change with thinning of the biapical pulmonary parenchyma. Trace
bilateral effusions are not excluded as the bilateral costophrenic
angles are excluded from view. A skin fold overlying is the right
lung apex. No definite pneumothorax. Unchanged bones.
IMPRESSION: 1. Suspected pulmonary edema superimposed on advanced emphysematous
change, though note underlying infection (including atypical
etiologies) could have a similar appearance.
2. Grossly unchanged left perihilar masslike consolidation.

## 2016-05-23 IMAGING — CR DG CHEST 1V PORT
1 series · 1 of 1 positions shown · non-contrast
Comparison: Chest radiograph 03/09/2015, 03/03/2015, and chest CT
03/03/2015

CLINICAL DATA: Respiratory failure.  Pneumonia.

EXAM:
PORTABLE CHEST - 1 VIEW

[AP]
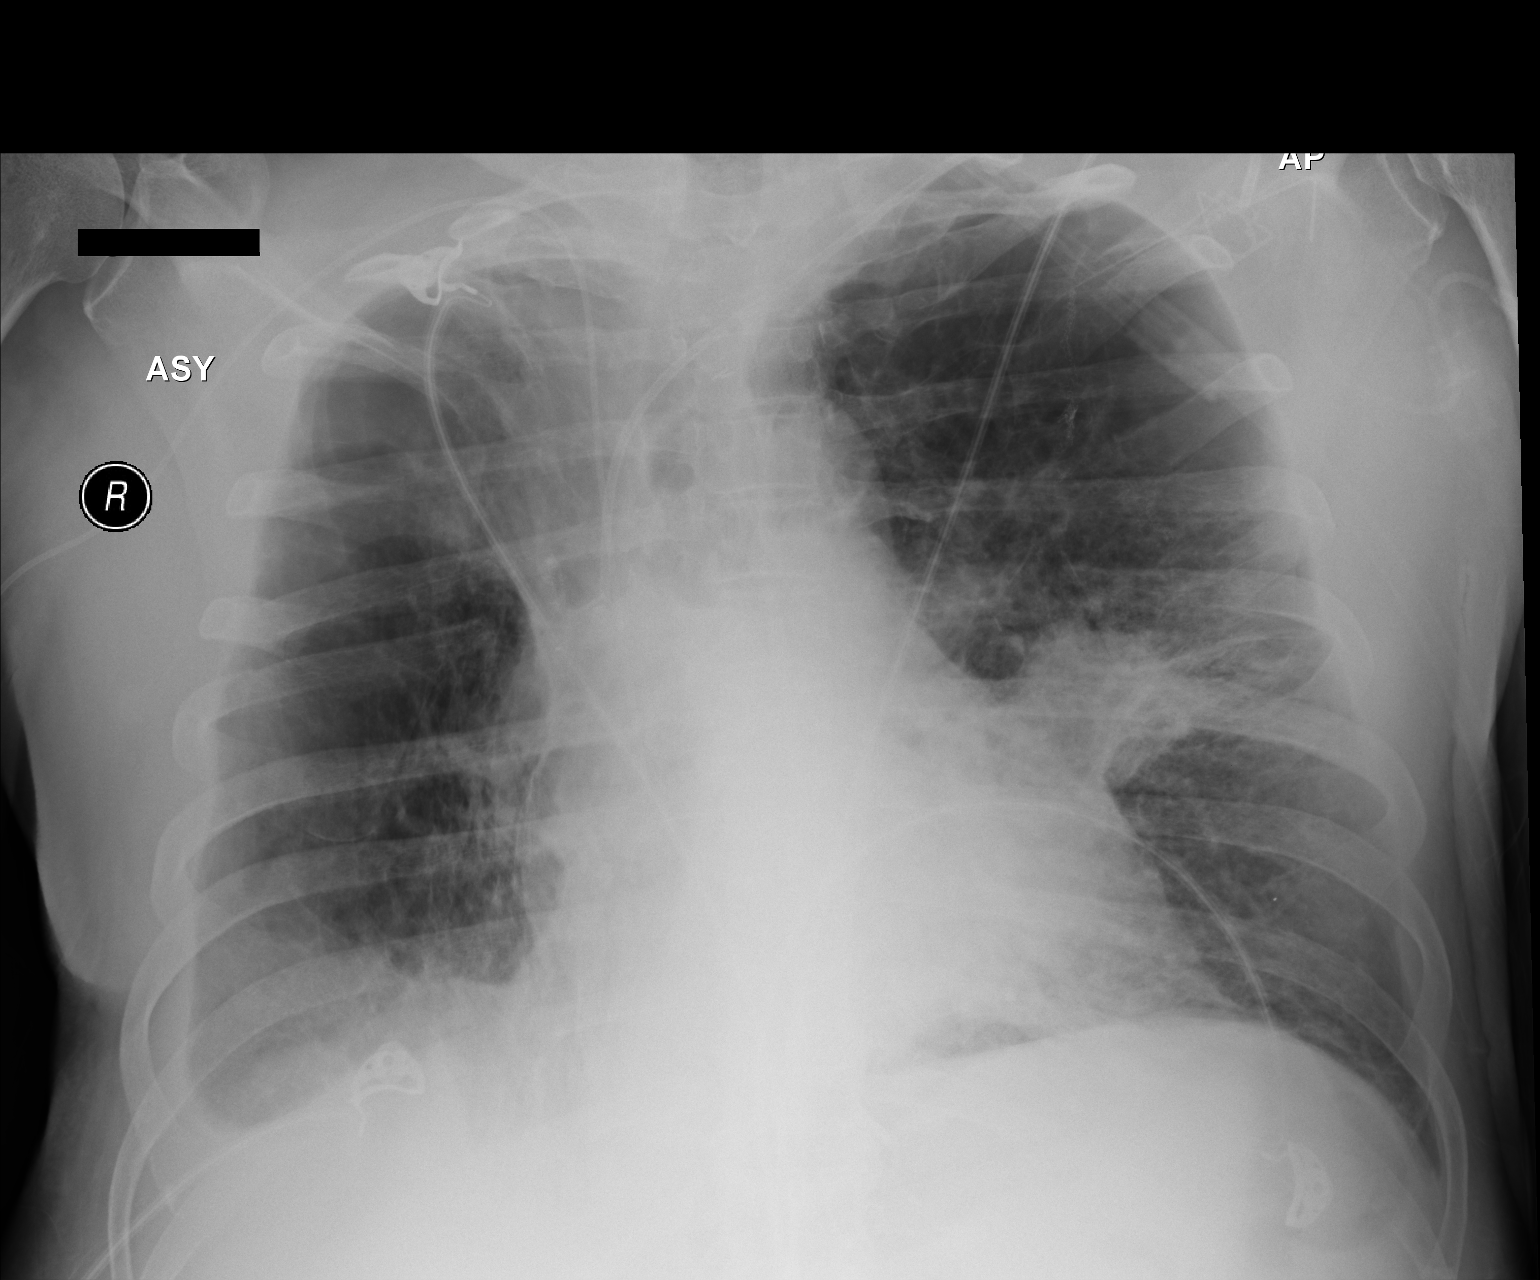

[1 of 1 positions shown; findings below may reference images not displayed]

FINDINGS: Left subclavian Port-A-Cath terminates in the proximal superior vena
cava. Right upper extremity PICC terminates in the distal superior
vena cava. Stable heart and mediastinal contours. Postoperative
changes of the right hemithorax and chronic underlying emphysema
bilaterally.

Confluent, masslike opacity in the left perihilar region continues
to decrease in size, but has not completely resolved. There is a
small right pleural effusion, unchanged.
IMPRESSION: Continued decrease in consolidative, masslike left perihilar
presumed pneumonia. Continued radiographic clearing is suggested, to
exclude an underlying mass.

Small right pleural effusion.

Advanced emphysema.

## 2016-05-27 IMAGING — CR DG CHEST 1V PORT
2 series · 2 of 2 positions shown · non-contrast
Comparison: 03/16/2015; 03/02/2015; chest CT - 03/03/2015

CLINICAL DATA: Chronic heart failure. Chronic respiratory failure.
History pneumonia.

EXAM:
PORTABLE CHEST - 1 VIEW

[AP (1 of 2)]
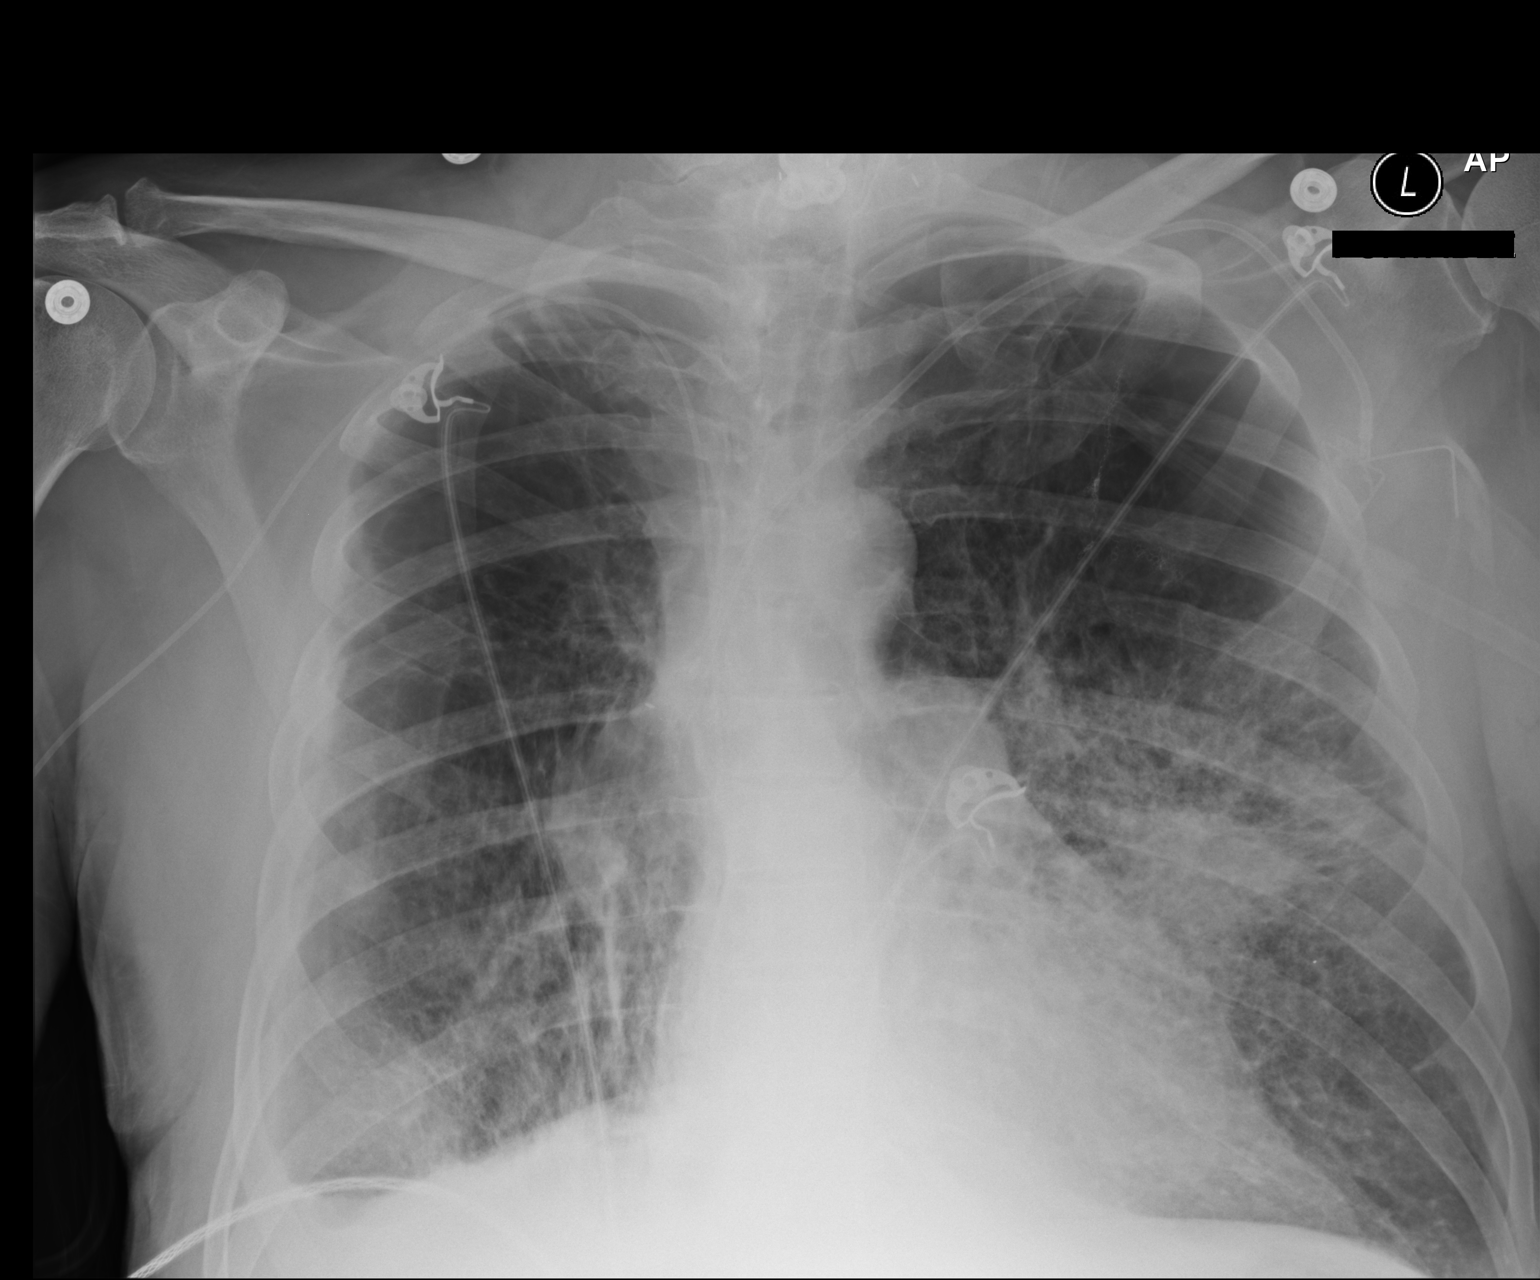

[AP (2 of 2)]
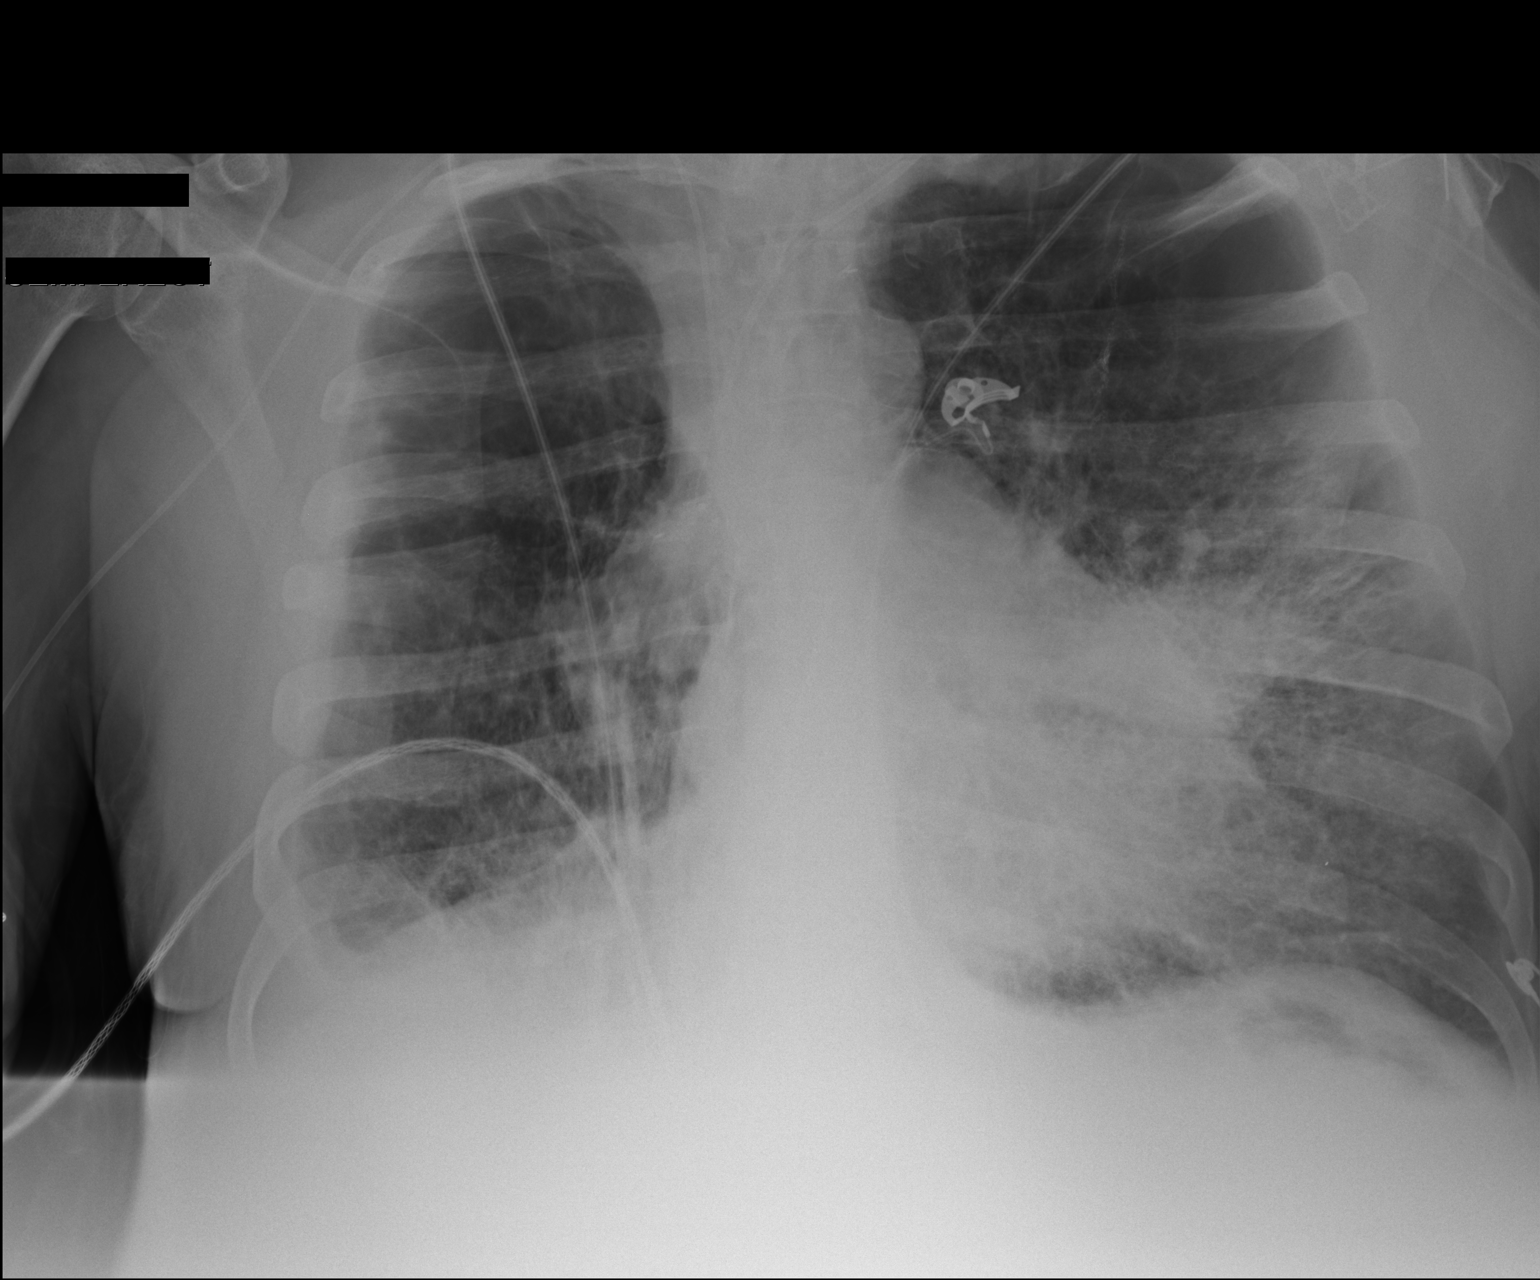

[2 of 2 positions shown; findings below may reference images not displayed]

FINDINGS: Grossly unchanged cardiac silhouette and mediastinal contours.
Stable position of support apparatus. The lungs remain
hyperexpanded. Left mid lung heterogeneous airspace opacities are
unchanged. Unchanged right basilar heterogeneous airspace opacities
no new focal airspace opacities. Unchanged trace bilateral
effusions, right greater than left. Stable postsurgical change of
the left upper lung. No pneumothorax. Unchanged bones. Post lower
cervical ACDF, incompletely evaluated.
IMPRESSION: 1. Similar findings of multi focal infection, left greater than
right. No new focal airspace opacities.
2. Stable postsurgical change of the left upper lung.
3. Similar findings of marked lung hyperexpansion.
